# Patient Record
Sex: Male | Born: 1995 | Race: Black or African American | Hispanic: No | Marital: Single | Smoking: Current every day smoker
Health system: Southern US, Community
[De-identification: ages and names within clinical notes are randomized; demographics above are authoritative.]

## PROBLEM LIST (undated history)

## (undated) ENCOUNTER — Emergency Department (HOSPITAL_COMMUNITY): Disposition: A | Discharge status: Home / Self Care | Payer: Medicaid Other

## (undated) DIAGNOSIS — R079 Chest pain, unspecified: Secondary | ICD-10-CM

## (undated) HISTORY — PX: TONSILLECTOMY: SUR1361

## (undated) HISTORY — PX: ADENOIDECTOMY: SUR15

## (undated) HISTORY — DX: Chest pain, unspecified: R07.9

---
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
## 2015-07-26 ENCOUNTER — Emergency Department (HOSPITAL_COMMUNITY)
Admission: EM | Admit: 2015-07-26 | Discharge: 2015-07-26 | Disposition: A | Discharge status: Home / Self Care | Payer: Medicaid Other | Attending: Emergency Medicine | Admitting: Emergency Medicine

## 2015-07-26 ENCOUNTER — Encounter (HOSPITAL_COMMUNITY): Payer: Self-pay

## 2015-07-26 DIAGNOSIS — R21 Rash and other nonspecific skin eruption: Secondary | ICD-10-CM | POA: Insufficient documentation

## 2015-07-26 DIAGNOSIS — F17210 Nicotine dependence, cigarettes, uncomplicated: Secondary | ICD-10-CM | POA: Insufficient documentation

## 2015-07-26 MED ORDER — DIPHENHYDRAMINE HCL 25 MG PO TABS: 25.0000 mg | ORAL_TABLET | Freq: Four times a day (QID) | ORAL | Status: DC | PRN

## 2015-07-26 MED ORDER — PREDNISONE 20 MG PO TABS
ORAL_TABLET | ORAL | Status: DC
Start: 2015-07-26 — End: 2019-07-08

## 2015-07-26 MED ORDER — DIPHENHYDRAMINE HCL 25 MG PO CAPS
25.0000 mg | ORAL_CAPSULE | Freq: Once | ORAL | Status: AC
  Administered 2015-07-26: 25 mg via ORAL
  Filled 2015-07-26: qty 1

## 2015-07-26 MED ORDER — PREDNISONE 20 MG PO TABS
60.0000 mg | ORAL_TABLET | Freq: Once | ORAL | Status: AC
  Administered 2015-07-26: 60 mg via ORAL
  Filled 2015-07-26: qty 3

## 2015-07-26 NOTE — ED Provider Notes (Signed)
CSN: [id number]     Arrival date & time 07/26/15  1430 History  By signing my name below, I, Adrian Martin, attest that this documentation has been prepared under the direction and in the presence of Garlon Hatchet, PA-C. Electronically Signed: Placido Martin, ED Scribe. 07/26/2015. 3:03 PM.   Chief Complaint  Patient presents with  . Rash   The history is provided by the patient. No language interpreter was used.    HPI Comments: Adrian Martin is a 20 y.o. male who presents to the Emergency Department complaining of worsening, mild, diffuse, non-pruritic, rash across his torso, back, extremities and face x 1 week. Pt notes that he recently began using a new soap but denies having taken any new medications. He denies taking any medications for his symptoms. He denies a PMHx of eczema. He has no known allergies. Pt denies fevers or any other associated symptoms at this time.    History reviewed. No pertinent past medical history. Past Surgical History  Procedure Laterality Date  . Tonsillectomy    . Adenoidectomy     Family History  Problem Relation Age of Onset  . Hypertension Mother   . Hypertension Father    Social History  Substance Use Topics  . Smoking status: Current Every Day Smoker    Types: Cigars  . Smokeless tobacco: Never Used  . Alcohol Use: Yes     Comment: occasinally    Review of Systems A complete 10 system review of systems was obtained and all systems are negative except as noted in the HPI and PMH.   Allergies  Review of patient's allergies indicates no known allergies.  Home Medications   Prior to Admission medications   Not on File   BP 102/64 mmHg  Pulse 74  Temp(Src) 98.6 F (37 C) (Oral)  Resp 16  Ht  (1.803 m)  Wt 160 lb (72.576 kg)  BMI 22.33 kg/m2  SpO2 96%   Physical Exam  Constitutional: He is oriented to person, place, and time. He appears well-developed and well-nourished.  HENT:  Head: Normocephalic and atraumatic.  No  oral lesions or swelling noted, airway widely patent  Eyes: EOM are normal.  Neck: Normal range of motion.  Cardiovascular: Normal rate.   Pulmonary/Chest: Effort normal. No respiratory distress.  Abdominal: Soft.  Musculoskeletal: Normal range of motion.  Neurological: He is alert and oriented to person, place, and time.  Skin: Skin is warm and dry. Rash noted. Rash is maculopapular.  Skin of face, arms, and torso appears very dry with tiny maculopapular rash noted; non-pruritic; no oral lesions; no lesions on palms/soles  Psychiatric: He has a normal mood and affect.  Nursing note and vitals reviewed.  ED Course  Procedures  DIAGNOSTIC STUDIES: Oxygen Saturation is 96% on RA, normal by my interpretation.    COORDINATION OF CARE: 2:59 PM Discussed next steps with pt. He verbalized understanding and is agreeable with the plan.   Labs Review Labs Reviewed - No data to display  Imaging Review No results found.   EKG Interpretation None      MDM   Final diagnoses:  Rash   20 year old male here with rash after using new soap. Skin of face, arms, and torso appears very dry with small maculopapular rash. No oral lesions or airway compromise. Patient with likely contact dermatitis. Instructed to avoid offending agent and to use unscented soaps, lotions, and detergents. Will treat with prednisone and benadryl.  No signs of secondary infection. No  lesions on palms/soles.  Follow up with PCP in 2-3 days. Return precautions discussed. Discussed plan with patient, he/she acknowledged understanding and agreed with plan of care.  Return precautions given for new or worsening symptoms.  I personally performed the services described in this documentation, which was scribed in my presence. The recorded information has been reviewed and is accurate.  Garlon HatchetLisa M Sanders, PA-C 07/26/15 1542  Tilden FossaElizabeth Rees, MD 07/27/15 [phone]

## 2015-07-26 NOTE — Discharge Instructions (Signed)
Start prednisone tomorrow, you have already had today's dose.  Continue benadryl every 6-8 hours as needed. Recommend to change back to gentle soap such as ivory, dove, etc. Return to the ED for new or worsening symptoms.

## 2015-07-26 NOTE — ED Notes (Signed)
Patient states that he noticed a rash on his arms a week ago and is now on face and rest of body.

## 2017-01-07 ENCOUNTER — Encounter (HOSPITAL_COMMUNITY): Payer: Self-pay | Admitting: Emergency Medicine

## 2017-01-07 ENCOUNTER — Emergency Department (HOSPITAL_COMMUNITY): Payer: Self-pay

## 2017-01-07 ENCOUNTER — Emergency Department (HOSPITAL_COMMUNITY)
Admission: EM | Admit: 2017-01-07 | Discharge: 2017-01-07 | Disposition: A | Discharge status: Home / Self Care | Payer: Self-pay | Attending: Emergency Medicine | Admitting: Emergency Medicine

## 2017-01-07 DIAGNOSIS — R079 Chest pain, unspecified: Secondary | ICD-10-CM

## 2017-01-07 DIAGNOSIS — R0789 Other chest pain: Secondary | ICD-10-CM | POA: Insufficient documentation

## 2017-01-07 DIAGNOSIS — F17290 Nicotine dependence, other tobacco product, uncomplicated: Secondary | ICD-10-CM | POA: Insufficient documentation

## 2017-01-07 LAB — BASIC METABOLIC PANEL
ANION GAP: 4 — AB (ref 5–15)
BUN: 17 mg/dL (ref 6–20)
CHLORIDE: 107 mmol/L (ref 101–111)
CO2: 28 mmol/L (ref 22–32)
Calcium: 9.3 mg/dL (ref 8.9–10.3)
Creatinine, Ser: 0.93 mg/dL (ref 0.61–1.24)
GFR calc Af Amer: >60 mL/min (ref >60)
GLUCOSE: 77 mg/dL (ref 65–99)
POTASSIUM: 3.9 mmol/L (ref 3.5–5.1)
Sodium: 139 mmol/L (ref 135–145)

## 2017-01-07 LAB — CBC
HEMATOCRIT: 41.9 % (ref 39.0–52.0)
HEMOGLOBIN: 13.9 g/dL (ref 13.0–17.0)
MCH: 29.7 pg (ref 26.0–34.0)
MCHC: 33.2 g/dL (ref 30.0–36.0)
MCV: 89.5 fL (ref 78.0–100.0)
Platelets: 260 10*3/uL (ref 150–400)
RBC: 4.68 MIL/uL (ref 4.22–5.81)
RDW: 12.3 % (ref 11.5–15.5)
WBC: 7.5 10*3/uL (ref 4.0–10.5)

## 2017-01-07 LAB — I-STAT TROPONIN, ED: Troponin i, poc: 0 ng/mL (ref 0.00–0.08)

## 2017-01-07 MED ORDER — IBUPROFEN 600 MG PO TABS: 600.0000 mg | ORAL_TABLET | Freq: Four times a day (QID) | ORAL | 0 refills | Status: DC | PRN

## 2017-01-07 NOTE — Discharge Instructions (Signed)
Please read attached information. If you experience any new or worsening signs or symptoms please return to the emergency room for evaluation. Please follow-up with your primary care provider or specialist as discussed. Please use medication prescribed only as directed and discontinue taking if you have any concerning signs or symptoms.   °

## 2017-01-07 NOTE — ED Provider Notes (Signed)
[facility]-EMERGENCY DEPT Provider Note   CSN: [id number] Arrival date & time: 01/07/17  1308     History   Chief Complaint Chief Complaint  Patient presents with  . Chest Pain    HPI Adrian Martin is a 21 y.o. male.  HPI    21 year old male presents today with complaints of chest pain.  Patient notes that over the last 4 days he has had intermittent upper chest pain.  He notes that this is tight and heavy, alternating between the left and right superior anterior portions of his chest.  He denies any radiation of symptoms.  He denies any associated shortness of breath, diaphoresis, nausea or vomiting.  Patient denies any abdominal pain.  He notes symptoms are not made worse with movements, inspiration.  Patient notes that once he gets up and starts moving rotating his upper extremities and torso symptoms improve.  Patient denies any history of DVT or PE, denies any significant risk factors were concerning signs or symptoms.  Patient denies any history of cardiac dysfunction in him or any of his close family.  Patient reports he smokes black in [place].   History reviewed. No pertinent past medical history.  There are no active problems to display for this patient.   Past Surgical History:  Procedure Laterality Date  . ADENOIDECTOMY    . TONSILLECTOMY       Home Medications    Prior to Admission medications   Medication Sig Start Date End Date Taking? Authorizing Provider  diphenhydrAMINE (BENADRYL) 25 MG tablet Take 1 tablet (25 mg total) by mouth every 6 (six) hours as needed for itching (Rash). 07/26/15   Garlon Hatchet, PA-C  ibuprofen (ADVIL,MOTRIN) 600 MG tablet Take 1 tablet (600 mg total) by mouth every 6 (six) hours as needed. 01/07/17   Chett Taniguchi, Tinnie Gens, PA-C  predniSONE (DELTASONE) 20 MG tablet Take 40 mg by mouth daily for 3 days, then  by mouth daily for 3 days, then  daily for 3 days 07/26/15   Garlon Hatchet, PA-C    Family History Family History  Problem  Relation Age of Onset  . Hypertension Mother   . Hypertension Father     Social History Social History  Substance Use Topics  . Smoking status: Current Every Day Smoker    Types: Cigars  . Smokeless tobacco: Never Used  . Alcohol use Yes     Comment: occasinally     Allergies   Patient has no known allergies.   Review of Systems Review of Systems  All other systems reviewed and are negative.    Physical Exam Updated Vital Signs BP 125/80   Pulse (!) 55   Temp 98.5 F (36.9 C) (Oral)   Resp 16   Ht  (1.803 m)   Wt 74.8 kg (165 lb)   SpO2 100%   BMI 23.01 kg/m   Physical Exam  Constitutional: He is oriented to person, place, and time. He appears well-developed and well-nourished.  HENT:  Head: Normocephalic and atraumatic.  Eyes: Pupils are equal, round, and reactive to light. Conjunctivae are normal. Right eye exhibits no discharge. Left eye exhibits no discharge. No scleral icterus.  Neck: Normal range of motion. No JVD present. No tracheal deviation present.  Cardiovascular: Normal rate, regular rhythm, normal heart sounds and intact distal pulses.  Exam reveals no gallop and no friction rub.   No murmur heard. Pulmonary/Chest: Effort normal and breath sounds normal. No stridor. No respiratory distress. He has no wheezes.  He has no rales. He exhibits no tenderness.  Musculoskeletal: He exhibits no edema.  Neurological: He is alert and oriented to person, place, and time. Coordination normal.  Psychiatric: He has a normal mood and affect. His behavior is normal. Judgment and thought content normal.  Nursing note and vitals reviewed.    ED Treatments / Results  Labs (all labs ordered are listed, but only abnormal results are displayed) Labs Reviewed  BASIC METABOLIC PANEL - Abnormal; Notable for the following:       Result Value   Anion gap 4 (*)    All other components within normal limits  CBC  I-STAT TROPONIN, ED    EKG  EKG  Interpretation None       Radiology Dg Chest 2 View  Result Date: 01/07/2017 CLINICAL DATA:  Chest pain EXAM: CHEST  2 VIEW COMPARISON:  None. FINDINGS: There is a small granuloma in the medial right base. Lungs are otherwise clear. Heart size and pulmonary vascularity are normal. No adenopathy. No pneumothorax. No bone lesions. IMPRESSION: No edema or consolidation.  Small granuloma medial right base. Electronically Signed   By: Bretta Bang III M.D.   On: 01/07/2017 14:35    Procedures Procedures (including critical care time)  Medications Ordered in ED Medications - No data to display   Initial Impression / Assessment and Plan / ED Course  I have reviewed the triage vital signs and the nursing notes.  Pertinent labs & imaging results that were available during my care of the patient were reviewed by me and considered in my medical decision making (see chart for details).      Final Clinical Impressions(s) / ED Diagnoses   Final diagnoses:  Chest pain, unspecified type    21 year old male presents today with chest pain.  He presently has no chest pain, has reassuring workup with negative troponin, reassuring chest x-ray Reassuring EKG. patient with a very low heart score with no signs of ACS, low Wells PERC negative with very low suspicion for PE.  I have low suspicion for any significant intrathoracic abnormality here today.  Patient will be instructed to use anti-inflammatories, follow-up with primary care or the ED with any new or worsening signs or symptoms.  He verbalized understanding and agreement to today's plan had no further questions or concerns.  New Prescriptions Discharge Medication List as of 01/07/2017  5:00 PM    START taking these medications   Details  ibuprofen (ADVIL,MOTRIN) 600 MG tablet Take 1 tablet (600 mg total) by mouth every 6 (six) hours as needed., Starting Tue 01/07/2017, Print         Corissa Oguinn, [place], PA-C 01/07/17 2110    Melene Plan, DO 01/08/17 [phone]

## 2017-01-07 NOTE — ED Triage Notes (Signed)
Pt reports central chest pain, nonradiating for about the last week. Worse with movement. Denies recent cough, fever, URI sx. VSS.

## 2018-06-28 ENCOUNTER — Encounter (HOSPITAL_COMMUNITY): Payer: Self-pay

## 2018-06-28 ENCOUNTER — Emergency Department (HOSPITAL_COMMUNITY)
Admission: EM | Admit: 2018-06-28 | Discharge: 2018-06-28 | Disposition: A | Discharge status: Home / Self Care | Payer: Self-pay | Attending: Emergency Medicine | Admitting: Emergency Medicine

## 2018-06-28 ENCOUNTER — Other Ambulatory Visit: Payer: Self-pay

## 2018-06-28 DIAGNOSIS — F17210 Nicotine dependence, cigarettes, uncomplicated: Secondary | ICD-10-CM | POA: Insufficient documentation

## 2018-06-28 DIAGNOSIS — R1915 Other abnormal bowel sounds: Secondary | ICD-10-CM | POA: Insufficient documentation

## 2018-06-28 DIAGNOSIS — R82998 Other abnormal findings in urine: Secondary | ICD-10-CM | POA: Insufficient documentation

## 2018-06-28 DIAGNOSIS — R198 Other specified symptoms and signs involving the digestive system and abdomen: Secondary | ICD-10-CM

## 2018-06-28 DIAGNOSIS — Z79899 Other long term (current) drug therapy: Secondary | ICD-10-CM | POA: Insufficient documentation

## 2018-06-28 LAB — URINALYSIS, ROUTINE W REFLEX MICROSCOPIC
Bacteria, UA: NONE SEEN
Bilirubin Urine: NEGATIVE
Glucose, UA: NEGATIVE mg/dL
KETONES UR: NEGATIVE mg/dL
LEUKOCYTE UA: NEGATIVE
Nitrite: NEGATIVE
PROTEIN: NEGATIVE mg/dL
Specific Gravity, Urine: 1.03 (ref 1.005–1.030)
pH: 5 (ref 5.0–8.0)

## 2018-06-28 NOTE — Discharge Instructions (Signed)
1.  You should follow-up with a family doctor for recheck.  If you do not have a family doctor, use the referral number in your discharge instructions to find one. 2.  You can use the discharge instructions regarding diarrhea and loose stool.  Try to eat a healthy balanced diet.  Eat meals at regular time intervals. 3.  A gonorrhea chlamydia test have been done.  Results should be available in 1 to 3 days.  If you develop any pain with urination, drainage from the penis or symptoms concerning for sexually transmitted disease, follow-up with the health department.

## 2018-06-28 NOTE — ED Notes (Signed)
Patient verbalizes understanding of discharge instructions. Opportunity for questioning and answers were provided. Armband removed by staff, pt discharged from ED.  

## 2018-06-28 NOTE — ED Provider Notes (Signed)
[facility] EMERGENCY DEPARTMENT Provider Note   CSN: [id number] Arrival date & time: 06/28/18  1456    History   Chief Complaint Chief Complaint  Patient presents with  . Urinary Tract Infection    HPI Adrian Martin is a 23 y.o. male.     HPI Patient reports he is here for urinary symptoms.  He reports that his urine has seemed kind of dark.  He denies any pain or burning with urination.  He denies frequency.  He denies pain in the testicles.  He reports that for a month now he gets some "rumbling" in his lower abdomen.  He denies there is any associated pain.  Reports sporadically he has loose stool.  He denies any fever, nausea, vomiting.  No upper respiratory symptoms.  He is sexually active.  He reports that his girlfriend gets seen periodically for yeast infections and he did not think he could get that but want to make sure that that was not something he could catch. History reviewed. No pertinent past medical history.  There are no active problems to display for this patient.   Past Surgical History:  Procedure Laterality Date  . ADENOIDECTOMY    . TONSILLECTOMY          Home Medications    Prior to Admission medications   Medication Sig Start Date End Date Taking? Authorizing Provider  diphenhydrAMINE (BENADRYL) 25 MG tablet Take 1 tablet (25 mg total) by mouth every 6 (six) hours as needed for itching (Rash). 07/26/15   Garlon Hatchet, PA-C  ibuprofen (ADVIL,MOTRIN) 600 MG tablet Take 1 tablet (600 mg total) by mouth every 6 (six) hours as needed. 01/07/17   Hedges, Tinnie Gens, PA-C  predniSONE (DELTASONE) 20 MG tablet Take 40 mg by mouth daily for 3 days, then 20mg  by mouth daily for 3 days, then 10mg  daily for 3 days 07/26/15   Garlon Hatchet, PA-C    Family History Family History  Problem Relation Age of Onset  . Hypertension Mother   . Hypertension Father     Social History Social History   Tobacco Use  . Smoking status: Current  Every Day Smoker    Types: Cigars  . Smokeless tobacco: Never Used  Substance Use Topics  . Alcohol use: Yes    Comment: occasinally  . Drug use: Yes    Types: Marijuana    Comment: 3-4 times a week     Allergies   Patient has no known allergies.   Review of Systems Review of Systems 10 Systems reviewed and are negative for acute change except as noted in the HPI.  Physical Exam Updated Vital Signs BP 136/79 (BP Location: Right Arm)   Pulse 83   Temp 98.4 F (36.9 C) (Oral)   Resp 16   Ht 5\' 11"  (1.803 m)   Wt 81.6 kg   SpO2 100%   BMI 25.10 kg/m   Physical Exam Constitutional:      Appearance: He is well-developed.  HENT:     Head: Normocephalic and atraumatic.  Eyes:     Extraocular Movements: Extraocular movements intact.     Conjunctiva/sclera: Conjunctivae normal.  Neck:     Musculoskeletal: Neck supple.  Cardiovascular:     Rate and Rhythm: Normal rate and regular rhythm.     Heart sounds: Normal heart sounds.  Pulmonary:     Effort: Pulmonary effort is normal.     Breath sounds: Normal breath sounds.  Abdominal:  General: Bowel sounds are normal. There is no distension.     Palpations: Abdomen is soft.     Tenderness: There is no abdominal tenderness.  Musculoskeletal: Normal range of motion.  Skin:    General: Skin is warm and dry.  Neurological:     Mental Status: He is alert and oriented to person, place, and time.     GCS: GCS eye subscore is 4. GCS verbal subscore is 5. GCS motor subscore is 6.     Coordination: Coordination normal.      ED Treatments / Results  Labs (all labs ordered are listed, but only abnormal results are displayed) Labs Reviewed  URINALYSIS, ROUTINE W REFLEX MICROSCOPIC - Abnormal; Notable for the following components:      Result Value   Hgb urine dipstick SMALL (*)    All other components within normal limits  URINE CULTURE  GC/CHLAMYDIA PROBE AMP ([place]) NOT AT [facility]    EKG None  Radiology No  results found.  Procedures Procedures (including critical care time)  Medications Ordered in ED Medications - No data to display   Initial Impression / Assessment and Plan / ED Course  I have reviewed the triage vital signs and the nursing notes.  Pertinent labs & imaging results that were available during my care of the patient were reviewed by me and considered in my medical decision making (see chart for details).       Patient reports he has been having some dark urine.  He seemed to have concern for possible exposure to a yeast infection vis--vis his girlfriend.  He denies significant concern for an STD.  He denies pain or burning with urination.  He denies testicular pain.  He denies any lesions in the genital area.  I did advise that a GC chlamydia has been tested empirically and patient is to follow-up on those results.  At this time, based on symptoms I do not believe he needs empiric treatment.  Patient also reports that he gets a lot of rumbling in his abdomen.  He does not have associated pain.  Reports intermittent loose stool.  Have made some dietary recommendations.  At this time no fever, no pain, no vomiting.  Patient stable for dietary management and outpatient follow-up.   Final Clinical Impressions(s) / ED Diagnoses   Final diagnoses:  Dark urine  [place]    ED Discharge Orders    None       Arby Barrette, MD 06/28/18 1659

## 2018-06-28 NOTE — ED Triage Notes (Signed)
Pt presents with c/o dark and foul smelling urine. Pt states his significant other has had recurrent yeast infections, states she told him "you probably have a yeast infection too because we keep having sex and I have one".

## 2018-06-28 NOTE — ED Notes (Signed)
Pt aware of need for urine sample, states unable to void at this time

## 2018-06-29 LAB — URINE CULTURE: Culture: NO GROWTH

## 2018-06-29 LAB — GC/CHLAMYDIA PROBE AMP (~~LOC~~) NOT AT ARMC
Chlamydia: NEGATIVE
NEISSERIA GONORRHEA: NEGATIVE

## 2019-03-19 IMAGING — CR DG CHEST 2V
2 series · 2 of 2 positions shown · non-contrast
Comparison: None.

CLINICAL DATA: Chest pain

EXAM:
CHEST  2 VIEW

[chest pa]
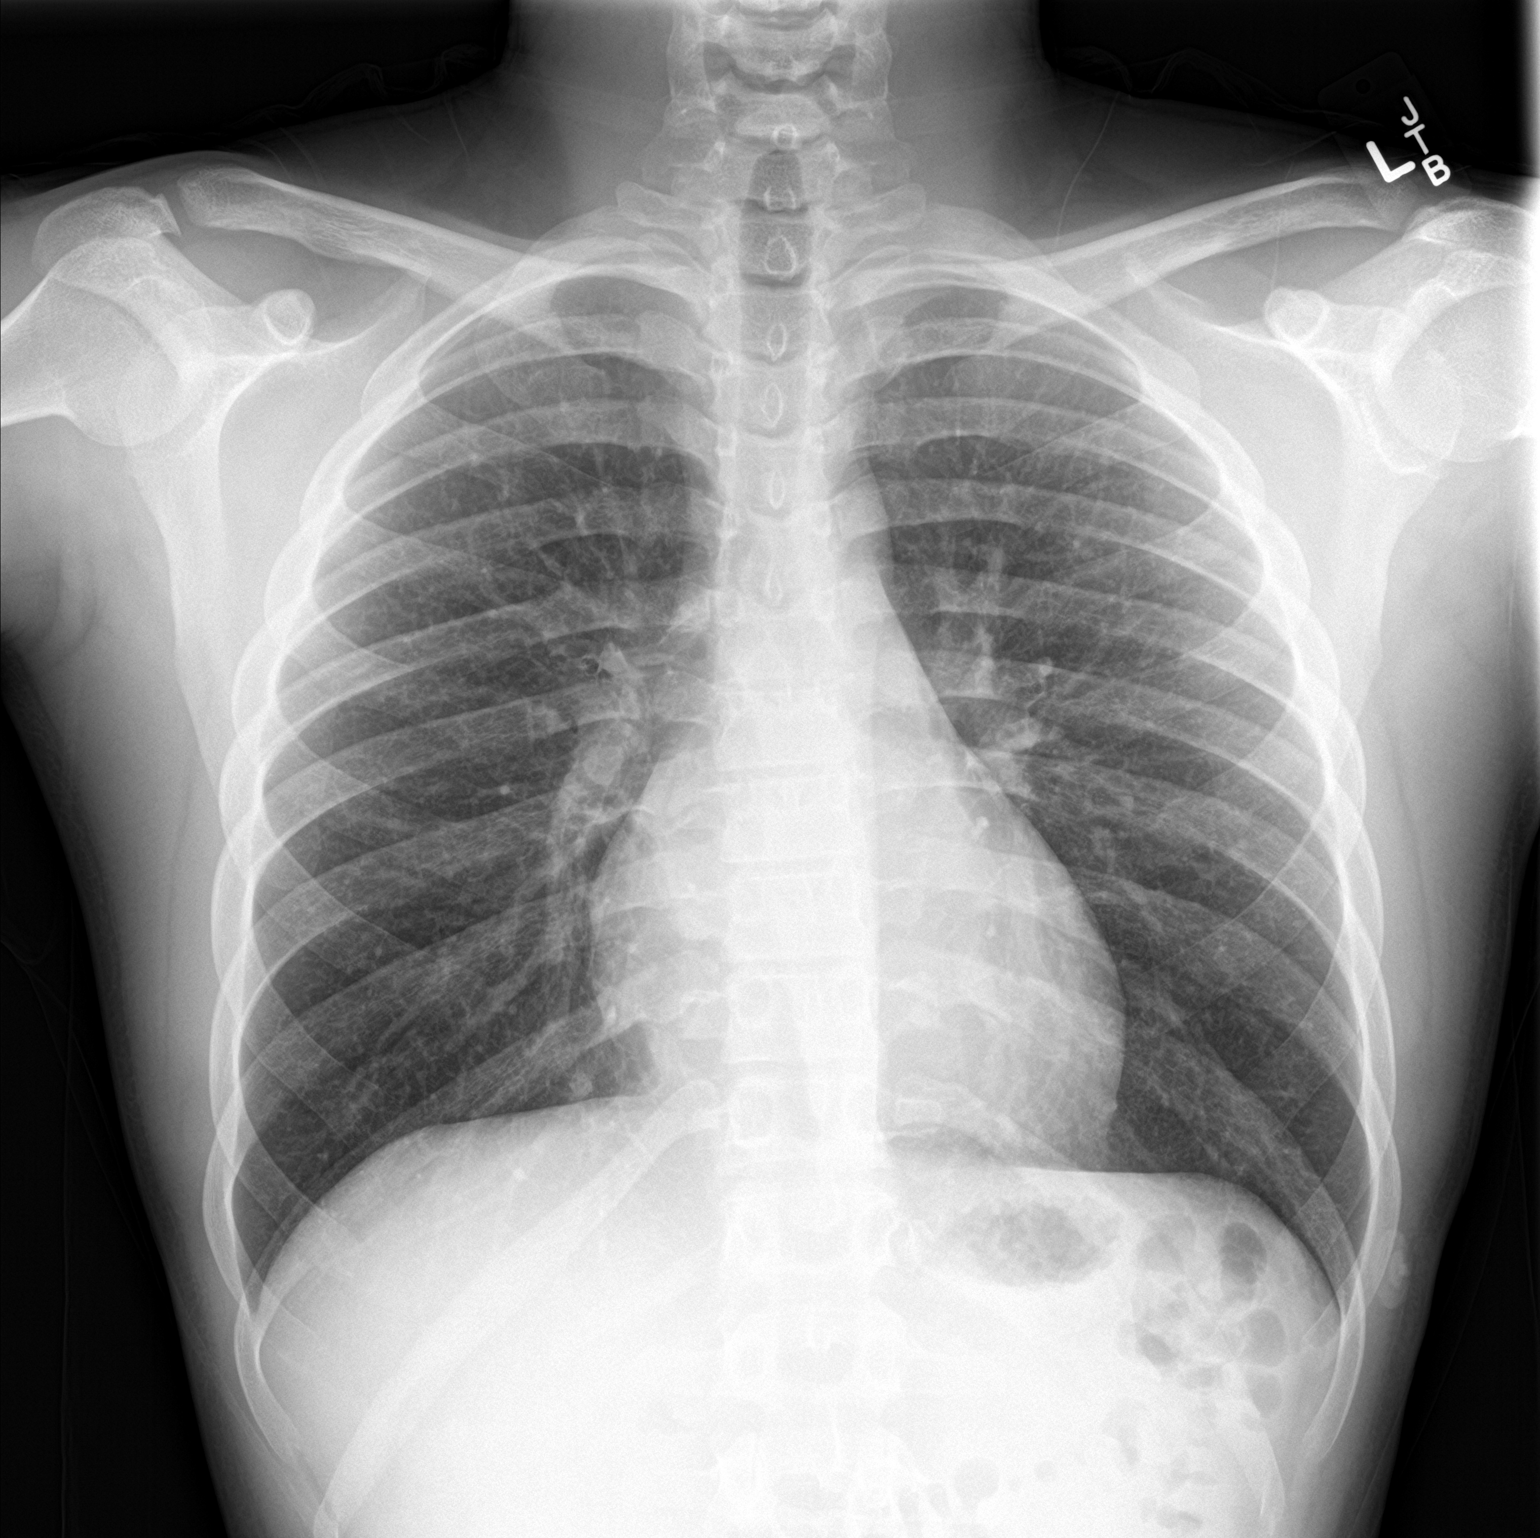

[chest lat]
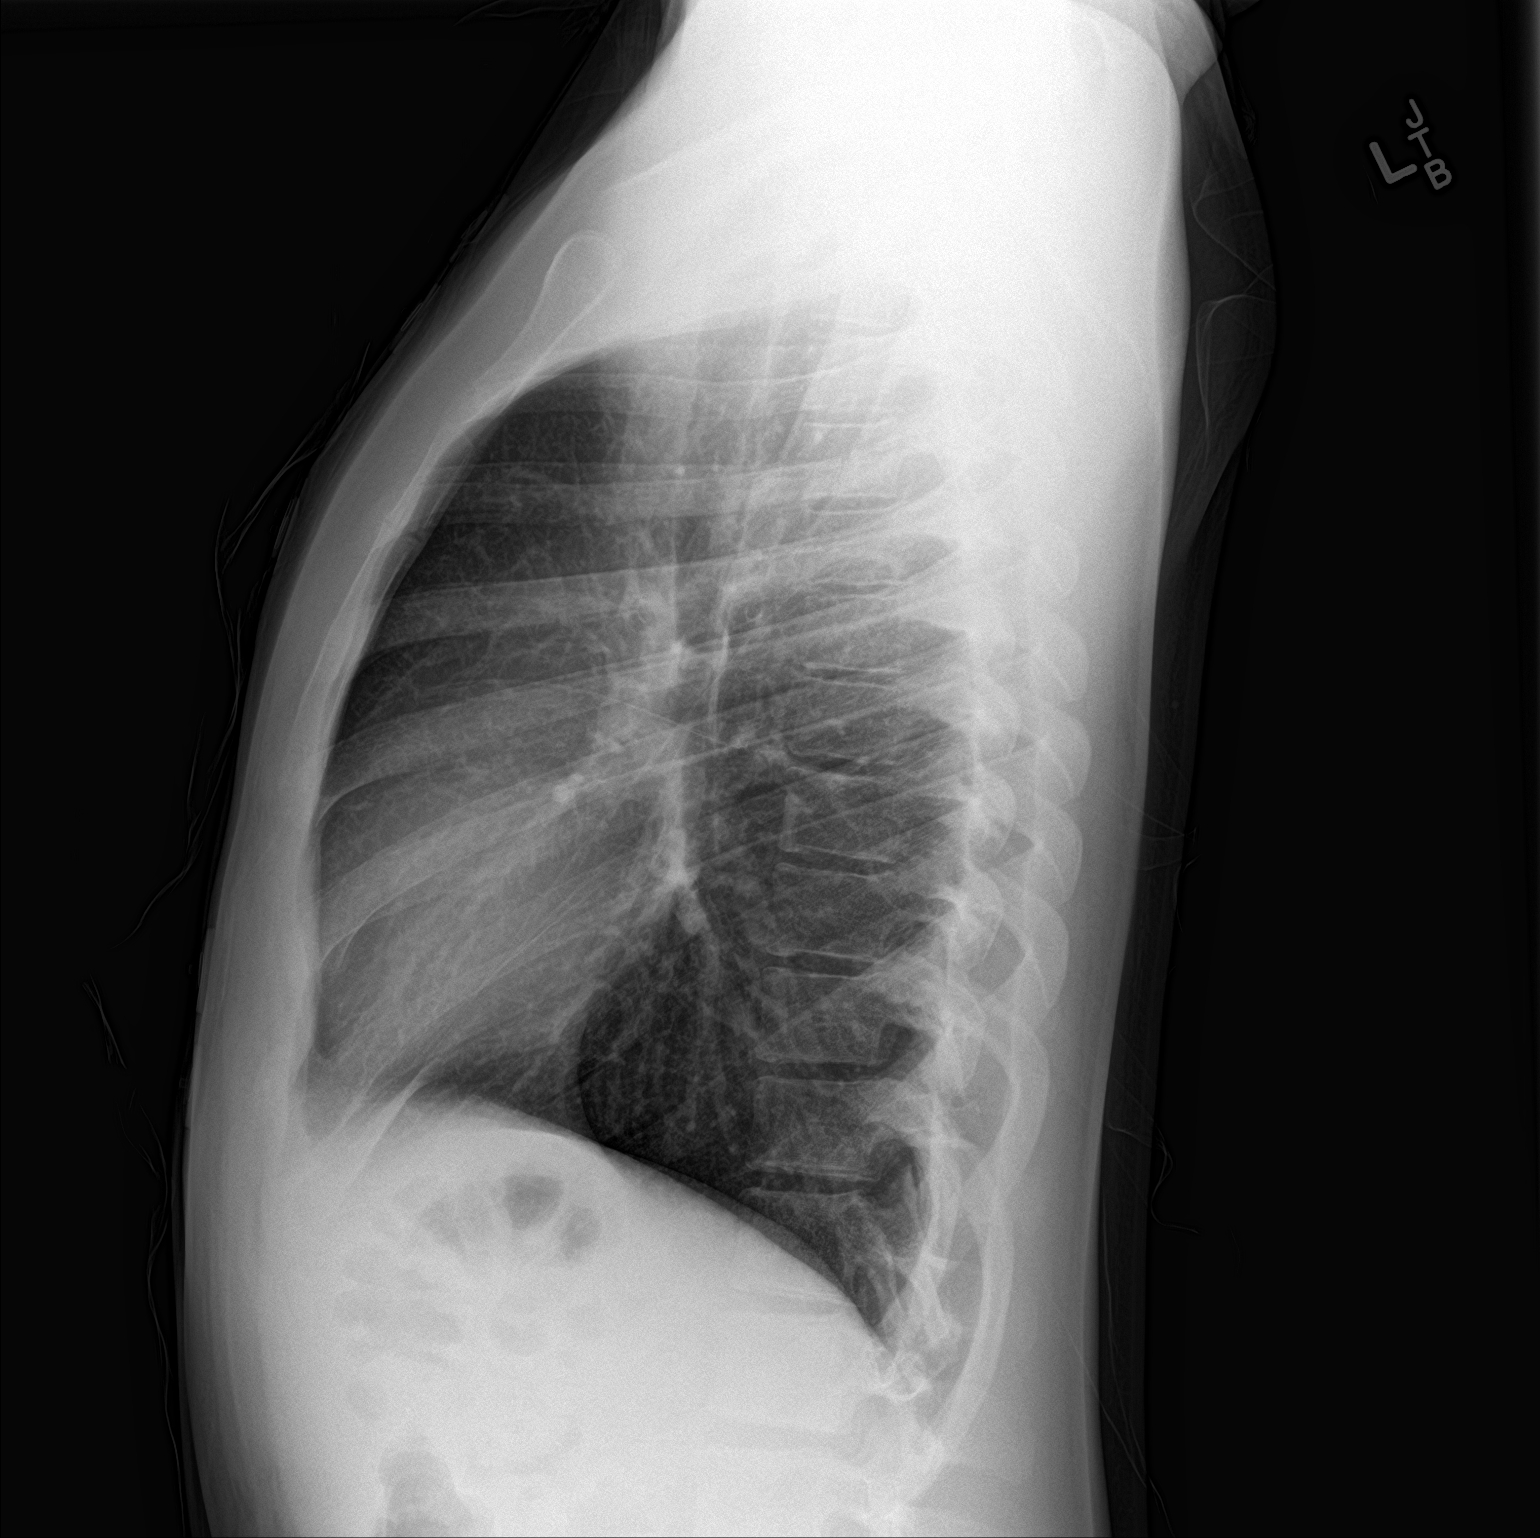

[2 of 2 positions shown; findings below may reference images not displayed]

FINDINGS: There is a small granuloma in the medial right base. Lungs are
otherwise clear. Heart size and pulmonary vascularity are normal. No
adenopathy. No pneumothorax. No bone lesions.
IMPRESSION: No edema or consolidation.  Small granuloma medial right base.

## 2019-07-08 ENCOUNTER — Ambulatory Visit (HOSPITAL_COMMUNITY)
Admission: EM | Admit: 2019-07-08 | Discharge: 2019-07-08 | Disposition: A | Discharge status: Home / Self Care | Payer: Medicaid Other | Attending: Family Medicine | Admitting: Family Medicine

## 2019-07-08 ENCOUNTER — Other Ambulatory Visit: Payer: Self-pay

## 2019-07-08 ENCOUNTER — Encounter (HOSPITAL_COMMUNITY): Payer: Self-pay

## 2019-07-08 DIAGNOSIS — Z20822 Contact with and (suspected) exposure to covid-19: Secondary | ICD-10-CM | POA: Diagnosis present

## 2019-07-08 NOTE — Discharge Instructions (Signed)
  You must quarantine at home until your test result is available You can check for your test result in MyChart  

## 2019-07-08 NOTE — ED Triage Notes (Signed)
Pt states he needs a COVID test for employer. Pt denies symptoms.

## 2019-07-08 NOTE — ED Provider Notes (Signed)
[facility]    CSN: [id number] Arrival date & time: 07/08/19  1710      History   Chief Complaint Chief Complaint  Patient presents with  . COVID test for employer    HPI Adrian Martin is a 24 y.o. male.   HPI  Patient is required to have a Covid test for his work He is asymptomatic No known exposure  History reviewed. No pertinent past medical history.  There are no problems to display for this patient.   Past Surgical History:  Procedure Laterality Date  . ADENOIDECTOMY    . TONSILLECTOMY         Home Medications    Prior to Admission medications   Medication Sig Start Date End Date Taking? Authorizing Provider  diphenhydrAMINE (BENADRYL) 25 MG tablet Take 1 tablet (25 mg total) by mouth every 6 (six) hours as needed for itching (Rash). 07/26/15 07/08/19  Larene Pickett, PA-C    Family History Family History  Problem Relation Age of Onset  . Hypertension Mother   . Hypertension Father     Social History Social History   Tobacco Use  . Smoking status: Current Every Day Smoker    Types: Cigars  . Smokeless tobacco: Never Used  . Tobacco comment: black and milds 2-3   Substance Use Topics  . Alcohol use: Yes    Comment: occasinally  . Drug use: Not Currently    Types: Marijuana    Comment: 3-4 times a week     Allergies   Patient has no known allergies.   Review of Systems Review of Systems  Constitutional:       Denies symptoms     Physical Exam Triage Vital Signs ED Triage Vitals [07/08/19 1814]  Enc Vitals Group     BP 136/81     Pulse Rate 82     Resp 16     Temp 98.6 F (37 C)     Temp Source Oral     SpO2 100 %     Weight 200 lb (90.7 kg)     Height 5\' 11"  (1.803 m)     Head Circumference      Peak Flow      Pain Score 0     Pain Loc      Pain Edu?      Excl. in [place]?    No data found.  Updated Vital Signs BP 136/81   Pulse 82   Temp 98.6 F (37 C) (Oral)   Resp 16   Ht 5\' 11"  (1.803 m)   Wt 90.7  kg   SpO2 100%   BMI 27.89 kg/m     Physical Exam Constitutional:      General: He is not in acute distress.    Appearance: He is well-developed.     Comments: Exam by observation.  Mask in place.  Pleasant and cooperative  HENT:     Head: Normocephalic and atraumatic.  Eyes:     Conjunctiva/sclera: Conjunctivae normal.     Pupils: Pupils are equal, round, and reactive to light.  Cardiovascular:     Rate and Rhythm: Normal rate.  Pulmonary:     Effort: Pulmonary effort is normal. No respiratory distress.  Musculoskeletal:        General: Normal range of motion.     Cervical back: Normal range of motion.  Skin:    General: Skin is warm and dry.  Neurological:     Mental Status: He  is alert.  Psychiatric:        Mood and Affect: Mood normal.      UC Treatments / Results  Labs (all labs ordered are listed, but only abnormal results are displayed) Labs Reviewed  SARS CORONAVIRUS 2 (TAT 6-24 HRS)    EKG   Radiology No results found.  Procedures Procedures (including critical care time)  Medications Ordered in UC Medications - No data to display  Initial Impression / Assessment and Plan / UC Course  I have reviewed the triage vital signs and the nursing notes.  Pertinent labs & imaging results that were available during my care of the patient were reviewed by me and considered in my medical decision making (see chart for details).      Final Clinical Impressions(s) / UC Diagnoses   Final diagnoses:  Encounter for screening laboratory testing for COVID-19 virus in asymptomatic patient     Discharge Instructions      You must quarantine at home until your test result is available You can check for your test result in MyChart    ED Prescriptions    None     PDMP not reviewed this encounter.   Eustace Moore, MD 07/08/19 2056

## 2019-07-09 LAB — SARS CORONAVIRUS 2 (TAT 6-24 HRS): SARS Coronavirus 2: NEGATIVE

## 2020-01-26 ENCOUNTER — Emergency Department (HOSPITAL_COMMUNITY)
Admission: EM | Admit: 2020-01-26 | Discharge: 2020-01-26 | Disposition: A | Discharge status: Home / Self Care | Payer: Medicaid Other | Attending: Emergency Medicine | Admitting: Emergency Medicine

## 2020-01-26 ENCOUNTER — Encounter (HOSPITAL_COMMUNITY): Payer: Self-pay | Admitting: Emergency Medicine

## 2020-01-26 ENCOUNTER — Emergency Department (HOSPITAL_COMMUNITY): Payer: Medicaid Other

## 2020-01-26 ENCOUNTER — Other Ambulatory Visit: Payer: Self-pay

## 2020-01-26 DIAGNOSIS — R0789 Other chest pain: Secondary | ICD-10-CM

## 2020-01-26 DIAGNOSIS — R079 Chest pain, unspecified: Secondary | ICD-10-CM | POA: Insufficient documentation

## 2020-01-26 DIAGNOSIS — F17290 Nicotine dependence, other tobacco product, uncomplicated: Secondary | ICD-10-CM | POA: Insufficient documentation

## 2020-01-26 LAB — CBC WITH DIFFERENTIAL/PLATELET
Abs Immature Granulocytes: 0.02 10*3/uL (ref 0.00–0.07)
Basophils Absolute: 0 10*3/uL (ref 0.0–0.1)
Basophils Relative: 1 %
Eosinophils Absolute: 0 10*3/uL (ref 0.0–0.5)
Eosinophils Relative: 1 %
HCT: 42.3 % (ref 39.0–52.0)
Hemoglobin: 14 g/dL (ref 13.0–17.0)
Immature Granulocytes: 0 %
Lymphocytes Relative: 18 %
Lymphs Abs: 1.4 10*3/uL (ref 0.7–4.0)
MCH: 31.2 pg (ref 26.0–34.0)
MCHC: 33.1 g/dL (ref 30.0–36.0)
MCV: 94.2 fL (ref 80.0–100.0)
Monocytes Absolute: 0.5 10*3/uL (ref 0.1–1.0)
Monocytes Relative: 7 %
Neutro Abs: 5.4 10*3/uL (ref 1.7–7.7)
Neutrophils Relative %: 73 %
Platelets: 377 10*3/uL (ref 150–400)
RBC: 4.49 MIL/uL (ref 4.22–5.81)
RDW: 12.2 % (ref 11.5–15.5)
WBC: 7.4 10*3/uL (ref 4.0–10.5)
nRBC: 0 % (ref 0.0–0.2)

## 2020-01-26 LAB — COMPREHENSIVE METABOLIC PANEL
ALT: 35 U/L (ref 0–44)
AST: 31 U/L (ref 15–41)
Albumin: 4.5 g/dL (ref 3.5–5.0)
Alkaline Phosphatase: 68 U/L (ref 38–126)
Anion gap: 11 (ref 5–15)
BUN: 9 mg/dL (ref 6–20)
CO2: 23 mmol/L (ref 22–32)
Calcium: 9.7 mg/dL (ref 8.9–10.3)
Chloride: 103 mmol/L (ref 98–111)
Creatinine, Ser: 0.97 mg/dL (ref 0.61–1.24)
GFR, Estimated: >60 mL/min (ref >60)
Glucose, Bld: 109 mg/dL — ABNORMAL HIGH (ref 70–99)
Potassium: 4.2 mmol/L (ref 3.5–5.1)
Sodium: 137 mmol/L (ref 135–145)
Total Bilirubin: 1.2 mg/dL (ref 0.3–1.2)
Total Protein: 7.6 g/dL (ref 6.5–8.1)

## 2020-01-26 NOTE — ED Triage Notes (Signed)
Pt c/o L sided chest pain that started this morning, reports ems came out and did a 12 lead that they reported looked okay, pt states pain has not got any better so he would like to get checked out. Denies worsening of pain with movement, no recent trauma or known injury, no cough or shortness of breath.

## 2020-01-26 NOTE — Discharge Instructions (Signed)
Your pain is likely from stress or muscle strain   Take tylenol, motrin for pain   See your doctor   Rest tomorrow   Return to ER if you have worse chest pain, shortness of breath

## 2020-01-26 NOTE — ED Provider Notes (Signed)
[facility] EMERGENCY DEPARTMENT Provider Note   CSN: [id number] Arrival date & time: 01/26/20  1251     History Chief Complaint  Patient presents with  . Chest Pain    Adrian Martin is a 24 y.o. male you presenting with chest pain.  Patient has left-sided chest pain for the last several days.  Patient states that he has a newborn who is still in the hospital.  He states that the newborn has a lot of problems so he is under a lot of stress.  He states that the pain is worse with movement and also at rest.  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  Denies any shortness of breath or fevers or cough.  Denies any heart problems in the past.  The history is provided by the patient.       History reviewed. No pertinent past medical history.  There are no problems to display for this patient.   Past Surgical History:  Procedure Laterality Date  . ADENOIDECTOMY    . TONSILLECTOMY         Family History  Problem Relation Age of Onset  . Hypertension Mother   . Hypertension Father     Social History   Tobacco Use  . Smoking status: Current Every Day Smoker    Types: Cigars  . Smokeless tobacco: Never Used  . Tobacco comment: black and milds 2-3   Substance Use Topics  . Alcohol use: Yes    Comment: occasinally  . Drug use: Not Currently    Types: Marijuana    Comment: 3-4 times a week    Home Medications Prior to Admission medications   Medication Sig Start Date End Date Taking? Authorizing Provider  diphenhydrAMINE (BENADRYL) 25 MG tablet Take 1 tablet (25 mg total) by mouth every 6 (six) hours as needed for itching (Rash). 07/26/15 07/08/19  Garlon Hatchet, PA-C    Allergies    Patient has no known allergies.  Review of Systems   Review of Systems  Cardiovascular: Positive for chest pain.  All other systems reviewed and are negative.   Physical Exam Updated Vital Signs BP 128/84 (BP Location: Left Arm)   Pulse 63    Temp 98.4 F (36.9 C)   Resp 16   SpO2 100%   Physical Exam Vitals and nursing note reviewed.  HENT:     Head: Normocephalic.  Eyes:     Pupils: Pupils are equal, round, and reactive to light.  Cardiovascular:     Rate and Rhythm: Normal rate and regular rhythm.     Heart sounds: Normal heart sounds.  Pulmonary:     Effort: Pulmonary effort is normal.     Breath sounds: Normal breath sounds.  Chest:     Comments: No reproducible tenderness  Abdominal:     General: Bowel sounds are normal.     Palpations: Abdomen is soft.  Musculoskeletal:        General: Normal range of motion.     Cervical back: Normal range of motion and neck supple.  Skin:    General: Skin is warm.     Capillary Refill: Capillary refill takes less than 2 seconds.  Neurological:     General: No focal deficit present.     Mental Status: He is alert and oriented to person, place, and time.  Psychiatric:        Mood and Affect: Mood normal.  Behavior: Behavior normal.     ED Results / Procedures / Treatments   Labs (all labs ordered are listed, but only abnormal results are displayed) Labs Reviewed  COMPREHENSIVE METABOLIC PANEL - Abnormal; Notable for the following components:      Result Value   Glucose, Bld 109 (*)    All other components within normal limits  CBC WITH DIFFERENTIAL/PLATELET    EKG EKG Interpretation  Date/Time:  Wednesday January 26 2020 13:09:46 EDT Ventricular Rate:  75 PR Interval:  136 QRS Duration: 80 QT Interval:  352 QTC Calculation: 393 R Axis:   44 Text Interpretation: Normal sinus rhythm ST elevation, consider early repolarization, pericarditis, or injury Abnormal ECG No significant change since last tracing Confirmed by Richardean Canal [phone]) on 01/26/2020 6:44:10 PM   Radiology DG Chest 2 View  Result Date: 01/26/2020 CLINICAL DATA:  Chest pain. EXAM: CHEST - 2 VIEW COMPARISON:  01/07/2017 FINDINGS: The heart size and mediastinal contours are within  normal limits. No consolidation. Small granuloma at the right lung base better seen on prior. No pleural effusions or pneumothorax. The visualized skeletal structures are unremarkable. IMPRESSION: No acute cardiopulmonary disease. Electronically Signed   By: Feliberto Harts MD   On: 01/26/2020 13:46    Procedures Procedures (including critical care time)  Medications Ordered in ED Medications - No data to display  ED Course  I have reviewed the triage vital signs and the nursing notes.  Pertinent labs & imaging results that were available during my care of the patient were reviewed by me and considered in my medical decision making (see chart for details).    MDM Rules/Calculators/A&P                         Adrian Martin is a 24 y.o. male who presented with chest pain.  EKG showed J-point elevation.  Patient's pain has been going on for several days.  Is worse with movement I think costochondritis versus stress.  Labs and chest x-ray are not remarkable.  I doubt ACS or PE.  Recommend Motrin as needed and outpatient follow-up.   Final Clinical Impression(s) / ED Diagnoses Final diagnoses:  None    Rx / DC Orders ED Discharge Orders    None       Charlynne Pander, MD 01/26/20 [phone]

## 2020-04-13 ENCOUNTER — Encounter (HOSPITAL_COMMUNITY): Payer: Self-pay | Admitting: Emergency Medicine

## 2020-04-13 ENCOUNTER — Other Ambulatory Visit: Payer: Self-pay

## 2020-04-13 ENCOUNTER — Ambulatory Visit (HOSPITAL_COMMUNITY)
Admission: EM | Admit: 2020-04-13 | Discharge: 2020-04-13 | Disposition: A | Discharge status: Home / Self Care | Payer: Medicaid Other | Attending: Family Medicine | Admitting: Family Medicine

## 2020-04-13 DIAGNOSIS — R0789 Other chest pain: Secondary | ICD-10-CM

## 2020-04-13 MED ORDER — MELOXICAM 15 MG PO TABS: 15.0000 mg | ORAL_TABLET | Freq: Every day | ORAL | 0 refills | Status: DC

## 2020-04-13 NOTE — Discharge Instructions (Signed)
Take meloxicam one time a day with food See cardiology if pain worsens

## 2020-04-13 NOTE — ED Triage Notes (Addendum)
Pt presents with chest pain. States was seen in Oct for same reason but was told possible muscle related. States pain comes and goes and feels like tightness or pulling sensation.   Selena Batten, RN notified of Pt's chest pain.

## 2020-04-13 NOTE — ED Provider Notes (Signed)
[facility]    CSN: [id number] Arrival date & time: 04/13/20  1542      History   Chief Complaint Chief Complaint  Patient presents with  . Chest Pain    HPI Adrian Martin is a 24 y.o. male.   HPI   Patient was seen in emergency room for chest pain a couple of months back.  It was 3 months ago, when his daughter was born.  She is now fine but the time she was in the ICU.  It was postulated he might be having chest pain because of stress.  He denies that he is having any stress right now but he still has chest pain.  It is every few days.  It is fleeting.  It is treated with Tylenol.  It is not exertional.  It is not worse with a deep breath.  He has no shortness of breath.  He has no underlying lung disease.  He has stopped smoking.  He has cut back on his alcohol.  He works doing a lot of heavy lifting.  He cannot reproduce it with arm movement, deep breath, or chest palpation.  He denies any GI distress.  Denies epigastric pain, heartburn, belching. He has been looking on the Internet.  He is still worried that he might have something wrong with his heart He has no family history of heart disease.  No hypertension, hyperlipidemia, diabetes, cigarette smoking (Black and mild only)  History reviewed. No pertinent past medical history.  There are no problems to display for this patient.   Past Surgical History:  Procedure Laterality Date  . ADENOIDECTOMY    . TONSILLECTOMY         Home Medications    Prior to Admission medications   Medication Sig Start Date End Date Taking? Authorizing Provider  meloxicam (MOBIC) 15 MG tablet Take 1 tablet (15 mg total) by mouth daily. 04/13/20  Yes Eustace Moore, MD  diphenhydrAMINE (BENADRYL) 25 MG tablet Take 1 tablet (25 mg total) by mouth every 6 (six) hours as needed for itching (Rash). 07/26/15 07/08/19  Garlon Hatchet, PA-C    Family History Family History  Problem Relation Age of Onset  . Hypertension Mother    . Hypertension Father     Social History Social History   Tobacco Use  . Smoking status: Current Every Day Smoker    Types: Cigars  . Smokeless tobacco: Never Used  . Tobacco comment: black and milds 2-3   Substance Use Topics  . Alcohol use: Yes    Comment: occasinally  . Drug use: Not Currently    Types: Marijuana    Comment: 3-4 times a week     Allergies   Patient has no known allergies.   Review of Systems Review of Systems See HPI  Physical Exam Triage Vital Signs ED Triage Vitals  Enc Vitals Group     BP 04/13/20 1829 124/70     Pulse Rate 04/13/20 1829 62     Resp 04/13/20 1829 16     Temp 04/13/20 1829 98.3 F (36.8 C)     Temp Source 04/13/20 1829 Oral     SpO2 04/13/20 1829 100 %     Weight --      Height --      Head Circumference --      Peak Flow --      Pain Score 04/13/20 1827 1     Pain Loc --  Pain Edu? --      Excl. in GC? --    No data found.  Updated Vital Signs BP 124/70 (BP Location: Right Arm)   Pulse 62   Temp 98.3 F (36.8 C) (Oral)   Resp 16   SpO2 100%      Physical Exam Vitals reviewed.  Constitutional:      General: He is not in acute distress.    Appearance: He is well-developed and well-nourished.  HENT:     Head: Normocephalic and atraumatic.     Mouth/Throat:     Mouth: Oropharynx is clear and moist.  Eyes:     Conjunctiva/sclera: Conjunctivae normal.     Pupils: Pupils are equal, round, and reactive to light.  Cardiovascular:     Rate and Rhythm: Normal rate and regular rhythm.     Heart sounds: Normal heart sounds.  Pulmonary:     Effort: Pulmonary effort is normal. No respiratory distress.     Breath sounds: Normal breath sounds.  Chest:     Chest wall: No tenderness.  Abdominal:     General: Abdomen is flat. There is no distension.     Palpations: Abdomen is soft.     Tenderness: There is no abdominal tenderness.  Musculoskeletal:        General: No edema. Normal range of motion.      Cervical back: Normal range of motion.  Skin:    General: Skin is warm and dry.  Neurological:     Mental Status: He is alert.  Psychiatric:        Behavior: Behavior normal.    Normal physical examination  UC Treatments / Results  Labs (all labs ordered are listed, but only abnormal results are displayed) Labs Reviewed - No data to display  EKG   Radiology No results found.  Procedures Procedures (including critical care time)  Medications Ordered in UC Medications - No data to display  Initial Impression / Assessment and Plan / UC Course  I have reviewed the triage vital signs and the nursing notes.  Pertinent labs & imaging results that were available during my care of the patient were reviewed by me and considered in my medical decision making (see chart for details).     EKG, chest x-ray, labs, and note from last visit are reviewed.  Patient does not have chest pain consistent with a cardiac etiology by history.  No cardiac risk factors.  In spite of this he has concern after recurring chest pain so will refer to cardiology. Final Clinical Impressions(s) / UC Diagnoses   Final diagnoses:  Chest wall pain  Atypical chest pain     Discharge Instructions     Take meloxicam one time a day with food See cardiology if pain worsens    ED Prescriptions    Medication Sig Dispense Auth. Provider   meloxicam (MOBIC) 15 MG tablet Take 1 tablet (15 mg total) by mouth daily. 30 tablet Eustace Moore, MD     PDMP not reviewed this encounter.   Eustace Moore, MD 04/13/20 2014

## 2020-05-10 ENCOUNTER — Other Ambulatory Visit: Payer: Self-pay

## 2020-05-10 ENCOUNTER — Encounter: Payer: Self-pay | Admitting: Internal Medicine

## 2020-05-10 ENCOUNTER — Other Ambulatory Visit: Payer: Self-pay | Admitting: *Deleted

## 2020-05-10 ENCOUNTER — Ambulatory Visit (INDEPENDENT_AMBULATORY_CARE_PROVIDER_SITE_OTHER): Payer: Self-pay | Admitting: Internal Medicine

## 2020-05-10 VITALS — BP 132/78 | HR 77 | Ht 70.0 in | Wt 196.0 lb

## 2020-05-10 DIAGNOSIS — R079 Chest pain, unspecified: Secondary | ICD-10-CM

## 2020-05-10 LAB — SEDIMENTATION RATE: Sed Rate: 4 mm/hr (ref 0–15)

## 2020-05-10 NOTE — Progress Notes (Signed)
Cardiology Office Note:    Date:  05/10/2020   ID:  Adrian Martin, DOB 1995-11-15, MRN [id number]  PCP:  Patient, No Pcp Per  [facility] Cardiologist:  Werner Lean, MD  [place] Electrophysiologist:  None   CC: Chest problems Consulted for the evaluation of chest pain at the behest of Dr. Lysle Morales   History of Present Illness:    Adrian Martin is a 25 y.o. male with a hx of chest wall pain former smoker with multiple ED visits who presents for evaluation 05/10/20.  Patient notes that he is feeling OK now.   Patient has had two episodes of chest pain that comes and goes.  All of this started in October.  Had a daughter around this time.  Had ED visit for chest pain with unremarkable findings and was told it was stress (woke with chest with chest tightness).  At that time improved with Tylenol and resolved spontaneously.  Improved  But then returned in late November and Early December.  No new stressors.  No change in activity.  Went to urgent care.  Last had chest pain yesterday mid day. Works at [employer], and loads heavy things.  We was off work at this point but still have chest tightness is sometimes on the right side and sometimes on the left side.    Discomfort occurs with laying down, worsens with no stressors, and improves spontaneously.  Patient exertion notable for his job at [employer] with and feels no symptoms.  No shortness of breath.  Notes that he thinks he has COVID in late August (anosmia, SOB, fevers and chills).  No PND or orthopnea.  No bendopnea, weight gain, leg swelling , or abdominal swelling.  No syncope or near syncope.  Never received COVID-19 Vaccine.   Notes no palpitations or funny heart beats.     No history prematurity.    Past Medical History:  Diagnosis Date  . Chest pain     Past Surgical History:  Procedure Laterality Date  . ADENOIDECTOMY    . TONSILLECTOMY      Current Medications: Current Meds  Medication Sig  . acetaminophen  (TYLENOL) 500 MG tablet Take 1,000 mg by mouth as needed for moderate pain or mild pain.  . meloxicam (MOBIC) 15 MG tablet Take 1 tablet (15 mg total) by mouth daily. (Patient taking differently: Take 15 mg by mouth as needed.)     Allergies:   Patient has no known allergies.   Social History   Socioeconomic History  . Marital status: Single    Spouse name: Not on file  . Number of children: Not on file  . Years of education: Not on file  . Highest education level: Not on file  Occupational History  . Not on file  Tobacco Use  . Smoking status: Current Every Day Smoker    Types: Cigars  . Smokeless tobacco: Never Used  . Tobacco comment: black and milds 2-3   Substance and Sexual Activity  . Alcohol use: Yes    Comment: occasinally  . Drug use: Not Currently    Types: Marijuana    Comment: 3-4 times a week  . Sexual activity: Not on file  Other Topics Concern  . Not on file  Social History Narrative  . Not on file   Social Determinants of Health   Financial Resource Strain: Not on file  Food Insecurity: Not on file  Transportation Needs: Not on file  Physical Activity: Not on file  Stress: Not on file  Social Connections: Not on file     Family History: The patient's family history includes Hypertension in his father and mother. History of coronary artery disease notable for no members. History of heart failure notable for no members.  History of arrhythmia notable for no members.  ROS:   Please see the history of present illness.     All other systems reviewed and are negative.  EKGs/Labs/Other Studies Reviewed:    The following studies were reviewed today:  EKG:   05/10/20:  SR rate 77 no ST elevations  01/27/20:  SR rate 75 septal ST elevations without reciprocal changes or PR depression; consider normal variant  Recent Labs: 01/26/2020: ALT 35; BUN 9; Creatinine, Ser 0.97; Hemoglobin 14.0; Platelets 377; Potassium 4.2; Sodium 137  Recent Lipid  Panel No results found for: CHOL, TRIG, HDL, CHOLHDL, VLDL, LDLCALC, LDLDIRECT   Risk Assessment/Calculations:     N/A  Physical Exam:    VS:  BP 132/78   Pulse 77   Ht $R'5\' 10"'ES$  (1.778 m)   Wt 196 lb (88.9 kg)   SpO2 98%   BMI 28.12 kg/m     Wt Readings from Last 3 Encounters:  05/10/20 196 lb (88.9 kg)  07/08/19 200 lb (90.7 kg)  06/28/18 180 lb (81.6 kg)     GEN:  Well nourished, well developed in no acute distress HEENT: Normal NECK: No JVD; No carotid bruits LYMPHATICS: No lymphadenopathy CARDIAC: RRR, no murmurs, rubs, gallops RESPIRATORY:  Clear to auscultation without rales, wheezing or rhonchi  ABDOMEN: Soft, non-tender, non-distended MUSCULOSKELETAL:  No edema; No deformity  SKIN: Warm and dry NEUROLOGIC:  Alert and oriented x 3 PSYCHIATRIC:  Normal affect   ASSESSMENT:    1. Chest pain of uncertain etiology    PLAN:    Chest Pain Syndrome - The patient presents with atypical symptoms - all started after viral prodrome - will get ESR, CRP and if negative, would recommend exerciseECG stress test (NPO at midnight); discussed risks, benefits, and alternatives of the diagnostic procedure including chest pain, arrhythmia, and death.  Patient amenable for testing.  Three months follow up unless new symptoms or abnormal test results warranting change in plan  Would be reasonable for Virtual Follow up  Would be reasonable for  APP Follow up  Shared Decision Making/Informed Consent The risks [chest pain, shortness of breath, cardiac arrhythmias, dizziness, blood pressure fluctuations, myocardial infarction, stroke/transient ischemic attack, and life-threatening complications (estimated to be 1 in 10,000)], benefits (risk stratification, diagnosing coronary artery disease, treatment guidance) and alternatives of an exercise tolerance test were discussed in detail with Adrian Martin and he agrees to proceed.      Medication Adjustments/Labs and Tests  Ordered: Current medicines are reviewed at length with the patient today.  Concerns regarding medicines are outlined above.  No orders of the defined types were placed in this encounter.  No orders of the defined types were placed in this encounter.   Patient Instructions  Medication Instructions:  Your provider recommends that you continue on your current medications as directed. Please refer to the Current Medication list given to you today.   *If you need a refill on your cardiac medications before your next appointment, please call your pharmacy*  Lab Work: TODAY! ESR, high sensitivity CRP If you have labs (blood work) drawn today and your tests are completely normal, you will receive your results only by: Marland Kitchen MyChart Message (if you have MyChart) OR . A paper  copy in the mail If you have any lab test that is abnormal or we need to change your treatment, we will call you to review the results.  Testing/Procedures: Your provider has requested that you have an exercise tolerance test. For further information please visit [email or web address]. Please also follow instruction sheet, as given. You will need a Covid screening prior to this procedure. COVID SCREENING INFORMATION: Pre-Procedural COVID-19 Testing Site [phone] [place], [place] [address] You will need to go home after your screening and quarantine until your procedure.   Follow-Up: At [facility], you and your health needs are our priority.  As part of our continuing mission to provide you with exceptional heart care, we have created designated Provider Care Teams.  These Care Teams include your primary Cardiologist (physician) and Advanced Practice Providers (APPs -  Physician Assistants and Nurse Practitioners) who all work together to provide you with the care you need, when you need it. Your next appointment:   6 month(s) The format for your next appointment:   In Person Provider:   You may see Werner Lean, MD or one of the following Advanced Practice Providers on your designated Care Team:    Melina Copa, PA-C  Ermalinda Barrios, PA-C      Signed, Werner Lean, MD  05/10/2020 9:35 AM    [place]

## 2020-05-10 NOTE — Patient Instructions (Signed)
Medication Instructions:  Your provider recommends that you continue on your current medications as directed. Please refer to the Current Medication list given to you today.   *If you need a refill on your cardiac medications before your next appointment, please call your pharmacy*  Lab Work: TODAY! ESR, high sensitivity CRP If you have labs (blood work) drawn today and your tests are completely normal, you will receive your results only by: Marland Kitchen MyChart Message (if you have MyChart) OR . A paper copy in the mail If you have any lab test that is abnormal or we need to change your treatment, we will call you to review the results.  Testing/Procedures: Your provider has requested that you have an exercise tolerance test. For further information please visit [email or web address]. Please also follow instruction sheet, as given. You will need a Covid screening prior to this procedure. COVID SCREENING INFORMATION: Pre-Procedural COVID-19 Testing Site [phone] [place], [place] [address] You will need to go home after your screening and quarantine until your procedure.   Follow-Up: At [facility], you and your health needs are our priority.  As part of our continuing mission to provide you with exceptional heart care, we have created designated Provider Care Teams.  These Care Teams include your primary Cardiologist (physician) and Advanced Practice Providers (APPs -  Physician Assistants and Nurse Practitioners) who all work together to provide you with the care you need, when you need it. Your next appointment:   6 month(s) The format for your next appointment:   In Person Provider:   You may see Werner Lean, MD or one of the following Advanced Practice Providers on your designated Care Team:    Melina Copa, PA-C  Ermalinda Barrios, PA-C

## 2020-05-10 NOTE — Addendum Note (Signed)
Addended by: Gunnar Fusi A on: 05/10/2020 10:26 AM   Modules accepted: Orders

## 2020-05-10 NOTE — Progress Notes (Signed)
The patient left Clinic before getting his labs drawn. New orders placed for ESR and CRP to be drawn 2/17 when scheduled for [facility].

## 2020-05-11 LAB — HIGH SENSITIVITY CRP: CRP, High Sensitivity: 0.39 mg/L (ref 0.00–3.00)

## 2020-05-12 NOTE — Addendum Note (Signed)
Addended by: Winifred Olive on: 05/12/2020 01:59 PM   Modules accepted: Orders

## 2020-05-29 ENCOUNTER — Other Ambulatory Visit (HOSPITAL_COMMUNITY)
Admission: RE | Admit: 2020-05-29 | Discharge: 2020-05-29 | Disposition: A | Discharge status: Home / Self Care | Payer: Medicaid Other | Source: Ambulatory Visit | Attending: Internal Medicine | Admitting: Internal Medicine

## 2020-05-29 DIAGNOSIS — Z20822 Contact with and (suspected) exposure to covid-19: Secondary | ICD-10-CM | POA: Diagnosis not present

## 2020-05-29 DIAGNOSIS — Z01812 Encounter for preprocedural laboratory examination: Secondary | ICD-10-CM | POA: Diagnosis not present

## 2020-05-29 LAB — SARS CORONAVIRUS 2 (TAT 6-24 HRS): SARS Coronavirus 2: NEGATIVE

## 2020-06-01 ENCOUNTER — Ambulatory Visit (INDEPENDENT_AMBULATORY_CARE_PROVIDER_SITE_OTHER): Payer: Medicaid Other

## 2020-06-01 ENCOUNTER — Other Ambulatory Visit: Payer: Medicaid Other

## 2020-06-01 ENCOUNTER — Other Ambulatory Visit: Payer: Self-pay

## 2020-06-01 DIAGNOSIS — R079 Chest pain, unspecified: Secondary | ICD-10-CM

## 2020-06-01 LAB — EXERCISE TOLERANCE TEST
Estimated workload: 13.7 METS
Exercise duration (min): 12 min
Exercise duration (sec): 0 s
MPHR: 196 {beats}/min
Peak HR: 179 {beats}/min
Percent HR: 91 %
Rest HR: 88 {beats}/min

## 2021-03-09 ENCOUNTER — Other Ambulatory Visit: Payer: Self-pay

## 2021-03-09 ENCOUNTER — Encounter (HOSPITAL_COMMUNITY): Payer: Self-pay

## 2021-03-09 ENCOUNTER — Emergency Department (HOSPITAL_COMMUNITY)
Admission: EM | Admit: 2021-03-09 | Discharge: 2021-03-09 | Disposition: A | Discharge status: Home / Self Care | Payer: Medicaid Other | Attending: Emergency Medicine | Admitting: Emergency Medicine

## 2021-03-09 DIAGNOSIS — S0990XA Unspecified injury of head, initial encounter: Secondary | ICD-10-CM

## 2021-03-09 DIAGNOSIS — Z23 Encounter for immunization: Secondary | ICD-10-CM | POA: Insufficient documentation

## 2021-03-09 DIAGNOSIS — F17290 Nicotine dependence, other tobacco product, uncomplicated: Secondary | ICD-10-CM | POA: Insufficient documentation

## 2021-03-09 DIAGNOSIS — S0102XA Laceration with foreign body of scalp, initial encounter: Secondary | ICD-10-CM | POA: Insufficient documentation

## 2021-03-09 DIAGNOSIS — S0181XA Laceration without foreign body of other part of head, initial encounter: Secondary | ICD-10-CM

## 2021-03-09 MED ORDER — TETANUS-DIPHTH-ACELL PERTUSSIS 5-2.5-18.5 LF-MCG/0.5 IM SUSY
0.5000 mL | PREFILLED_SYRINGE | Freq: Once | INTRAMUSCULAR | Status: AC
  Administered 2021-03-09: 0.5 mL via INTRAMUSCULAR
  Filled 2021-03-09: qty 0.5

## 2021-03-09 MED ORDER — LIDOCAINE-EPINEPHRINE (PF) 2 %-1:200000 IJ SOLN
10.0000 mL | Freq: Once | INTRAMUSCULAR | Status: AC
  Administered 2021-03-09: 10 mL
  Filled 2021-03-09: qty 20
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
## 2021-03-09 NOTE — ED Notes (Signed)
Steri strips applied per Dr. Renaye Rakers.
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
## 2021-03-09 NOTE — ED Notes (Addendum)
An After Visit Summary was printed and given to the patient. Discharge instructions given and no further questions at this time.  Pt states friend is driving him home.  

## 2021-03-09 NOTE — Discharge Instructions (Signed)
You had 7 stitches placed in your larger wound, one stitch placed in the smaller wound sided.  The stitches need to be removed in 5 days (Dec 1st or 2nd).  This can be done at your doctor's office or at an urgent care, does not require the emergency department.  Keep your wound dry for the next 48 hours.  After that you can shower normally.  Do not rub or pull your stitches.  Do not put any ointment on your stitches.  You can leave the Steri-Strips on until they fall off on their own.  We talked about the likelihood that there will be some scar forming here.  The scarring process takes many months to complete.  The way the wound looks in 1 week will not be the way it looks several months from now.  You can reduce your scar formation by keeping this wound covered for at least 3 months when you go outside, either with a hat or a loose bandage, to avoid direct sunlight (UV light) on it.  We also removed a small piece of glass from one of your wounds.  I did not see any other glass left in the wound.  If you start having new redness spreading across your forehead, or pus coming from the wound, please come back to the ER immediately.  These may be signs of an infection or a small glass piece left in the wound.
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
## 2021-03-16 ENCOUNTER — Ambulatory Visit (HOSPITAL_COMMUNITY)
Admission: EM | Admit: 2021-03-16 | Discharge: 2021-03-16 | Disposition: A | Discharge status: Home / Self Care | Payer: Medicaid Other

## 2021-03-16 ENCOUNTER — Other Ambulatory Visit: Payer: Self-pay

## 2022-04-06 IMAGING — CR DG CHEST 2V
2 series · 2 of 2 positions shown · non-contrast
Comparison: 01/07/2017

CLINICAL DATA: Chest pain.

EXAM:
CHEST - 2 VIEW

[chest pa]
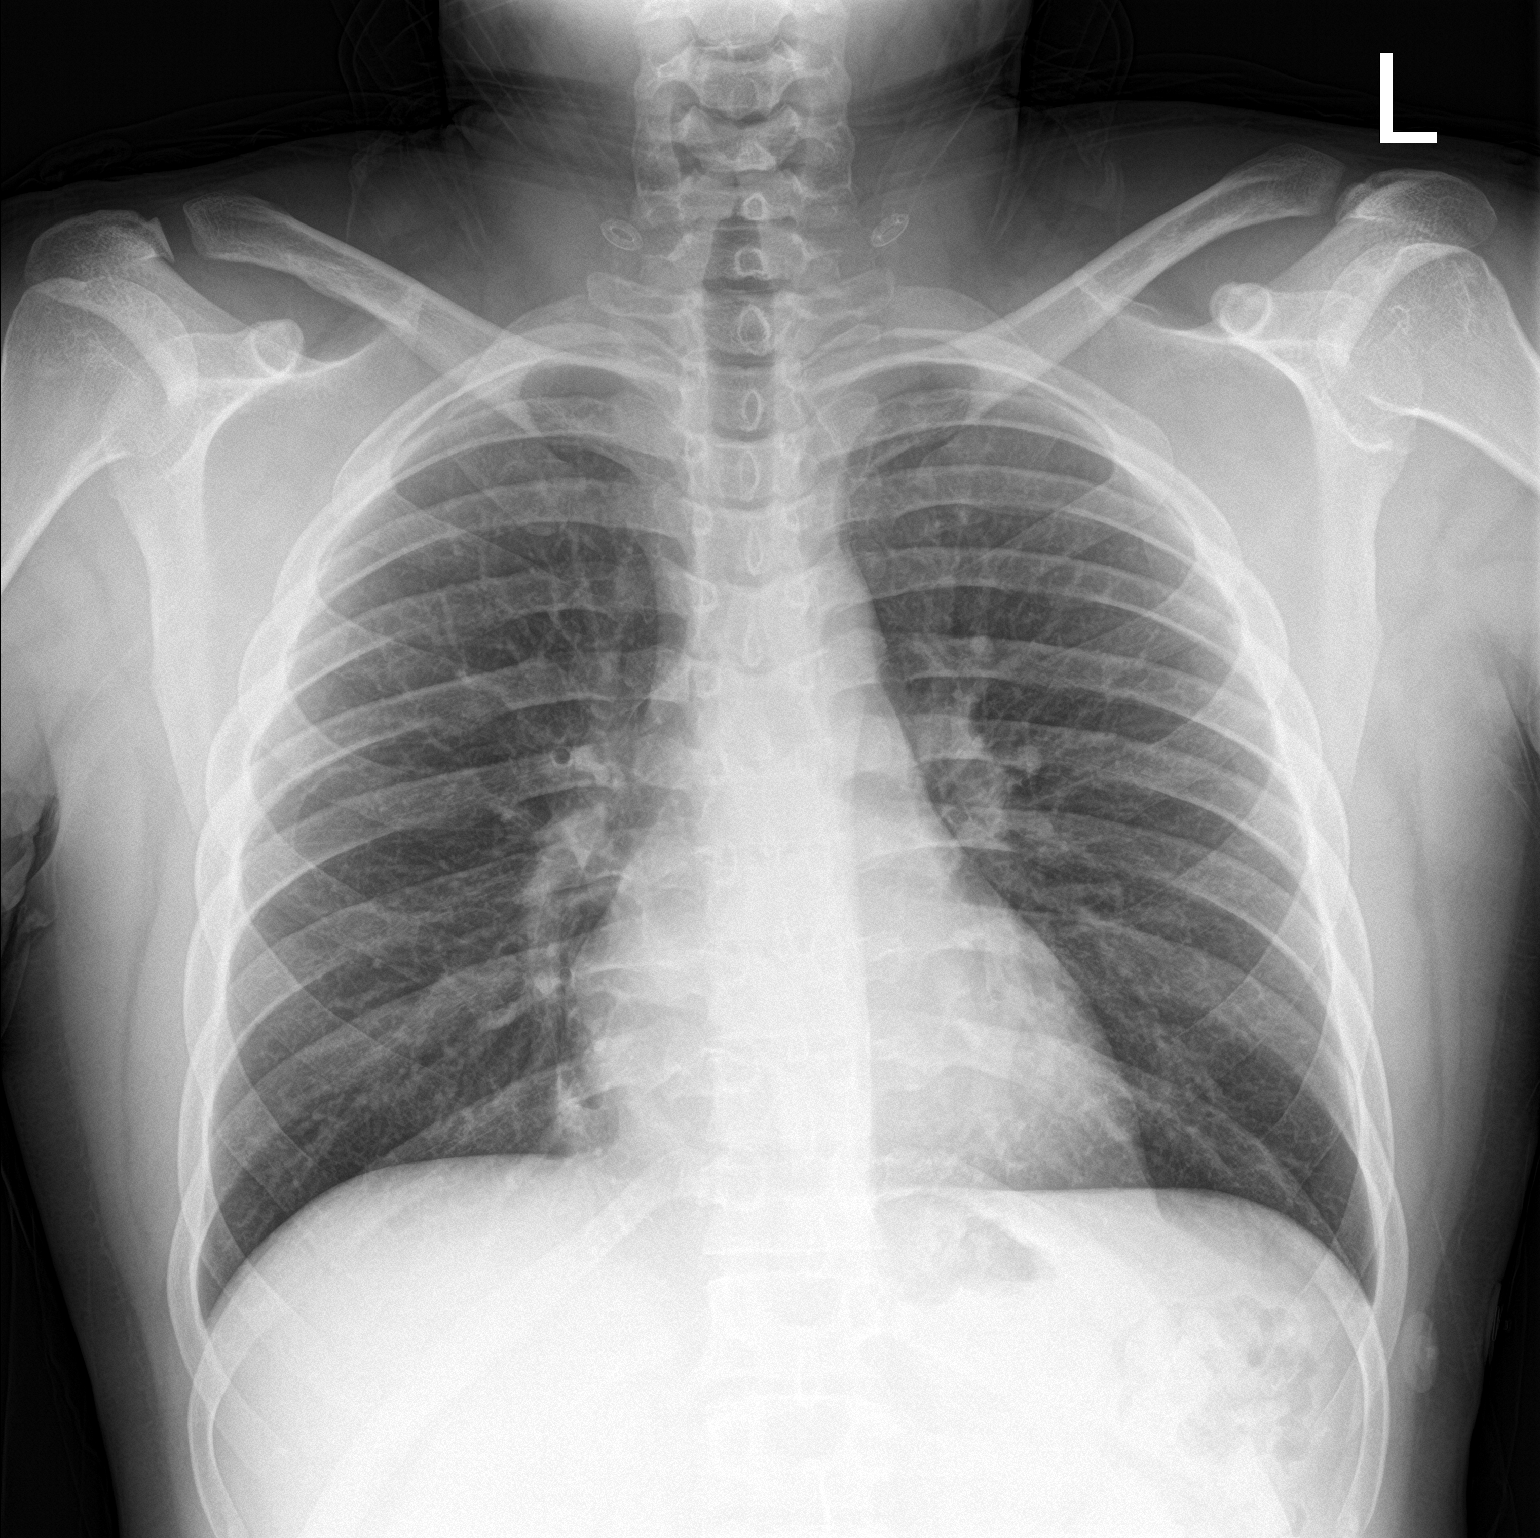

[chest lat]
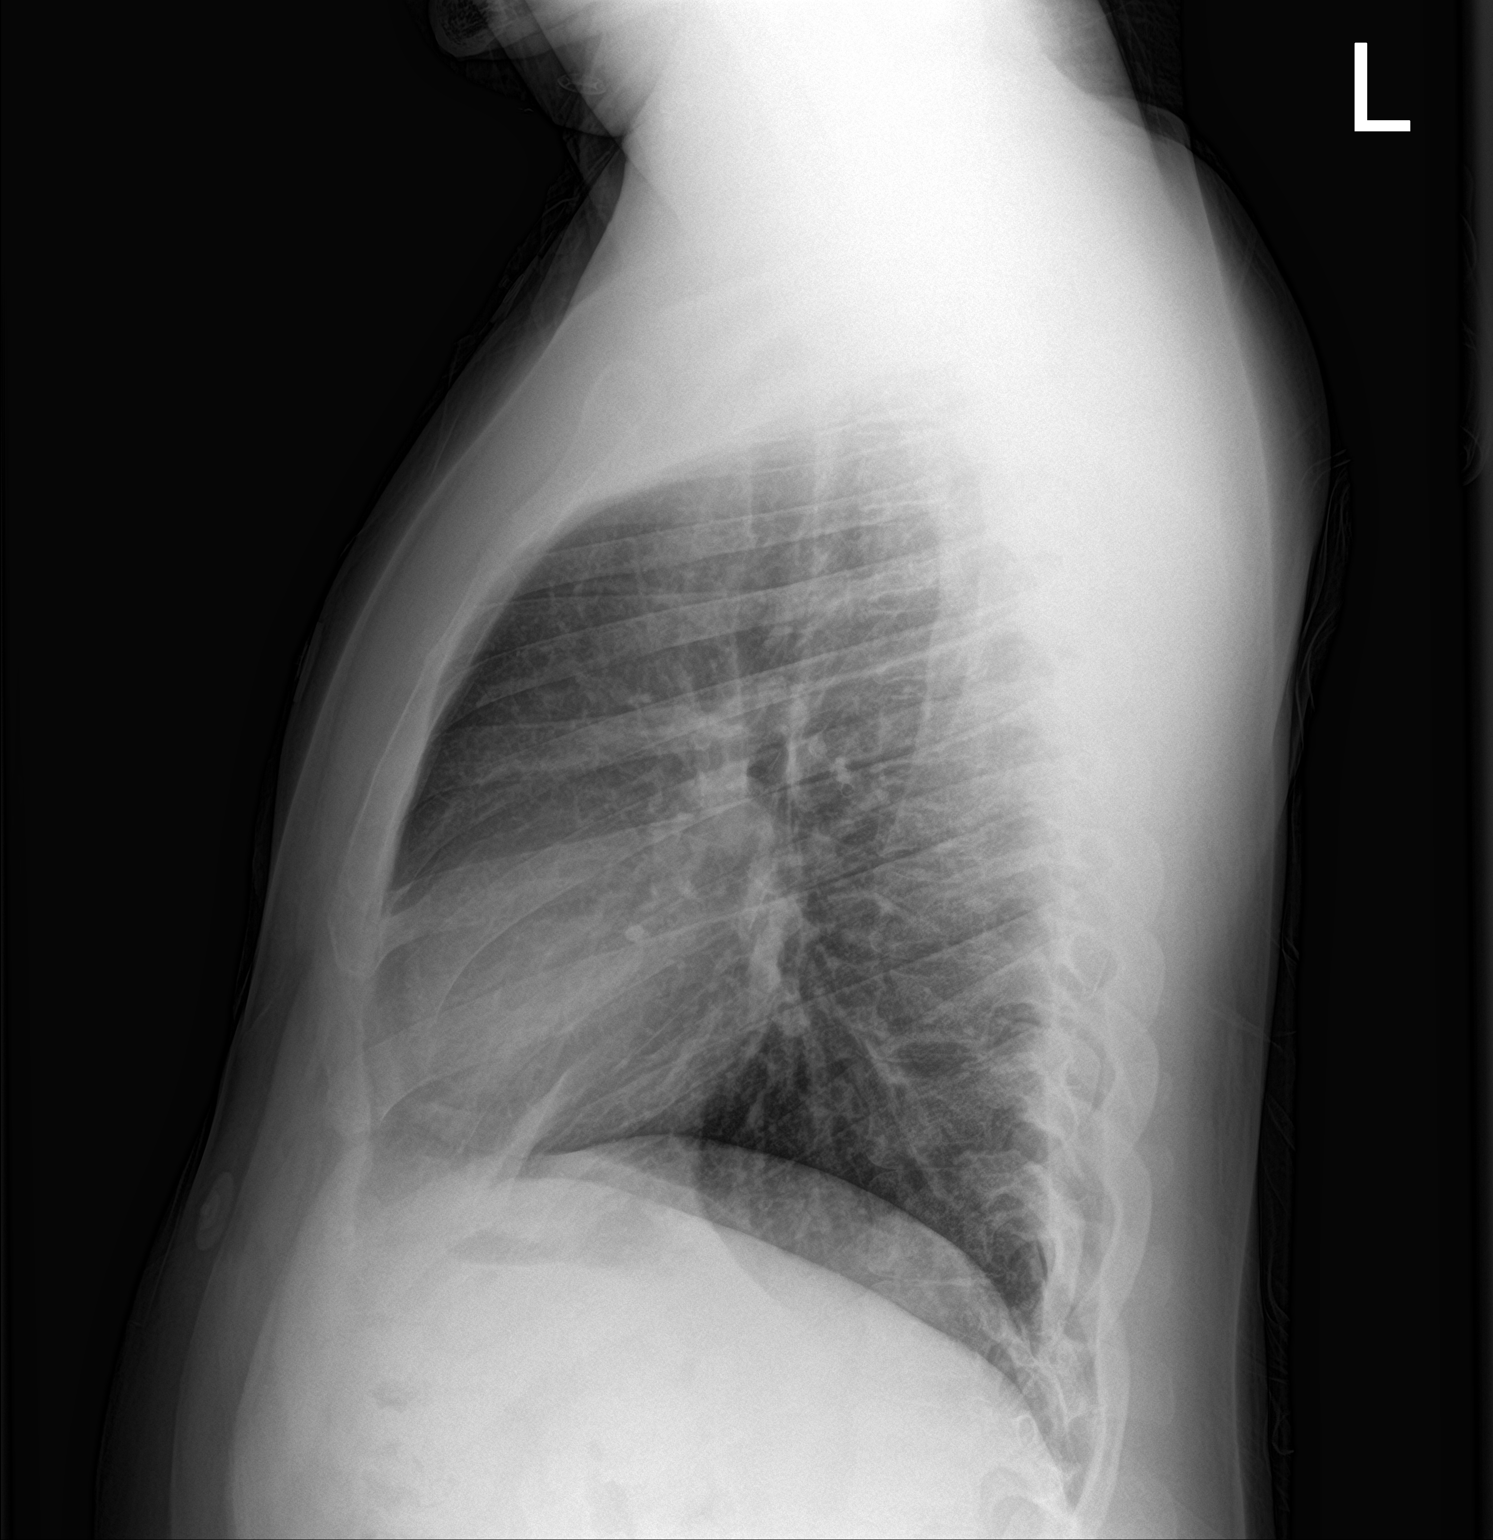

[2 of 2 positions shown; findings below may reference images not displayed]

FINDINGS: The heart size and mediastinal contours are within normal limits. No
consolidation. Small granuloma at the right lung base better seen on
prior. No pleural effusions or pneumothorax. The visualized skeletal
structures are unremarkable.
IMPRESSION: No acute cardiopulmonary disease.

## 2022-08-27 ENCOUNTER — Other Ambulatory Visit: Payer: Self-pay

## 2022-08-27 ENCOUNTER — Emergency Department (HOSPITAL_COMMUNITY)
Admission: EM | Admit: 2022-08-27 | Discharge: 2022-08-28 | Disposition: A | Discharge status: Home / Self Care | Payer: Medicaid Other | Attending: Emergency Medicine | Admitting: Emergency Medicine

## 2022-08-27 ENCOUNTER — Encounter (HOSPITAL_COMMUNITY): Payer: Self-pay

## 2022-08-27 DIAGNOSIS — T50901A Poisoning by unspecified drugs, medicaments and biological substances, accidental (unintentional), initial encounter: Secondary | ICD-10-CM | POA: Insufficient documentation

## 2022-08-27 DIAGNOSIS — T6591XA Toxic effect of unspecified substance, accidental (unintentional), initial encounter: Secondary | ICD-10-CM

## 2022-08-27 DIAGNOSIS — E876 Hypokalemia: Secondary | ICD-10-CM | POA: Diagnosis not present

## 2022-08-27 DIAGNOSIS — R4182 Altered mental status, unspecified: Secondary | ICD-10-CM

## 2022-08-27 LAB — COMPREHENSIVE METABOLIC PANEL
ALT: 25 U/L (ref 0–44)
AST: 24 U/L (ref 15–41)
Albumin: 4.5 g/dL (ref 3.5–5.0)
Alkaline Phosphatase: 55 U/L (ref 38–126)
Anion gap: 16 — ABNORMAL HIGH (ref 5–15)
BUN: 7 mg/dL (ref 6–20)
CO2: 19 mmol/L — ABNORMAL LOW (ref 22–32)
Calcium: 8.9 mg/dL (ref 8.9–10.3)
Chloride: 108 mmol/L (ref 98–111)
Creatinine, Ser: 0.99 mg/dL (ref 0.61–1.24)
GFR, Estimated: >60 mL/min (ref >60)
Glucose, Bld: 122 mg/dL — ABNORMAL HIGH (ref 70–99)
Potassium: 3.3 mmol/L — ABNORMAL LOW (ref 3.5–5.1)
Sodium: 143 mmol/L (ref 135–145)
Total Bilirubin: 0.7 mg/dL (ref 0.3–1.2)
Total Protein: 8.1 g/dL (ref 6.5–8.1)

## 2022-08-27 LAB — CBC WITH DIFFERENTIAL/PLATELET
Abs Immature Granulocytes: 0.01 10*3/uL (ref 0.00–0.07)
Basophils Absolute: 0.1 10*3/uL (ref 0.0–0.1)
Basophils Relative: 1 %
Eosinophils Absolute: 0 10*3/uL (ref 0.0–0.5)
Eosinophils Relative: 0 %
HCT: 41.8 % (ref 39.0–52.0)
Hemoglobin: 14 g/dL (ref 13.0–17.0)
Immature Granulocytes: 0 %
Lymphocytes Relative: 46 %
Lymphs Abs: 3.6 10*3/uL (ref 0.7–4.0)
MCH: 31.4 pg (ref 26.0–34.0)
MCHC: 33.5 g/dL (ref 30.0–36.0)
MCV: 93.7 fL (ref 80.0–100.0)
Monocytes Absolute: 0.6 10*3/uL (ref 0.1–1.0)
Monocytes Relative: 7 %
Neutro Abs: 3.6 10*3/uL (ref 1.7–7.7)
Neutrophils Relative %: 46 %
Platelets: 387 10*3/uL (ref 150–400)
RBC: 4.46 MIL/uL (ref 4.22–5.81)
RDW: 12.8 % (ref 11.5–15.5)
WBC: 7.9 10*3/uL (ref 4.0–10.5)
nRBC: 0 % (ref 0.0–0.2)

## 2022-08-27 LAB — ETHANOL: Alcohol, Ethyl (B): 62 mg/dL — ABNORMAL HIGH (ref <10)
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
## 2022-12-10 ENCOUNTER — Encounter (HOSPITAL_COMMUNITY): Payer: Self-pay | Admitting: Emergency Medicine

## 2022-12-10 ENCOUNTER — Ambulatory Visit (HOSPITAL_COMMUNITY)
Admission: EM | Admit: 2022-12-10 | Discharge: 2022-12-10 | Disposition: A | Discharge status: Home / Self Care | Payer: Medicaid Other | Attending: Family Medicine | Admitting: Family Medicine

## 2022-12-10 DIAGNOSIS — R079 Chest pain, unspecified: Secondary | ICD-10-CM | POA: Diagnosis not present

## 2022-12-10 MED ORDER — IBUPROFEN 800 MG PO TABS: 800.0000 mg | ORAL_TABLET | Freq: Three times a day (TID) | ORAL | 0 refills | Status: AC | PRN

## 2022-12-10 NOTE — ED Triage Notes (Signed)
Pt c/o intermittent generalized chest pains that has been for past several days that is positional. Reports hasn't gone to work due to the pains. Pt hasn't tried anything to help with pain.

## 2022-12-10 NOTE — Discharge Instructions (Signed)
Take ibuprofen 800 mg--1 tab every 8 hours as needed for pain.   

## 2022-12-10 NOTE — ED Provider Notes (Signed)
[facility]    CSN: [id number] Arrival date & time: 12/10/22  1702      History   Chief Complaint Chief Complaint  Patient presents with   Chest Pain    HPI Adrian Martin is a 27 y.o. male.    Chest Pain Here for pain or tightness in his anterior chest.  Is been bothering him for 4 or 5 days.  It seems to be a little worse when he lies back on his back. No fever or cough or congestion.  No shortness of breath.  Movement has not really exacerbated it nor does taking a deep breath.  He has had this happen before.  He tried to go back to work today and they wanted him to be evaluated before he returned to work.   Past Medical History:  Diagnosis Date   Chest pain     Patient Active Problem List   Diagnosis Date Noted   Chest pain of uncertain etiology 05/10/2020    Past Surgical History:  Procedure Laterality Date   ADENOIDECTOMY     TONSILLECTOMY         Home Medications    Prior to Admission medications   Medication Sig Start Date End Date Taking? Authorizing Provider  ibuprofen (ADVIL) 800 MG tablet Take 1 tablet (800 mg total) by mouth every 8 (eight) hours as needed (pain). 12/10/22  Yes Zenia Resides, MD  acetaminophen (TYLENOL) 500 MG tablet Take 1,000 mg by mouth as needed for moderate pain or mild pain.    [provider]    Family History Family History  Problem Relation Age of Onset   Hypertension Mother    Hypertension Father     Social History Social History   Tobacco Use   Smoking status: Every Day    Types: Cigars   Smokeless tobacco: Never   Tobacco comments:    black and milds 2-3   Vaping Use   Vaping status: Never Used  Substance Use Topics   Alcohol use: Yes    Comment: occasinally   Drug use: Not Currently    Types: Marijuana    Comment: 3-4 times a week     Allergies   Patient has no known allergies.   Review of Systems Review of Systems  Cardiovascular:  Positive for chest pain.      Physical Exam Triage Vital Signs ED Triage Vitals  Encounter Vitals Group     BP 12/10/22 1857 137/84     Systolic BP Percentile --      Diastolic BP Percentile --      Pulse Rate 12/10/22 1857 81     Resp 12/10/22 1857 14     Temp 12/10/22 1857 98.4 F (36.9 C)     Temp Source 12/10/22 1857 Oral     SpO2 12/10/22 1857 98 %     Weight --      Height --      Head Circumference --      Peak Flow --      Pain Score 12/10/22 1858 7     Pain Loc --      Pain Education --      Exclude from Growth Chart --    No data found.  Updated Vital Signs BP 137/84 (BP Location: Left Arm)   Pulse 81   Temp 98.4 F (36.9 C) (Oral)   Resp 14   SpO2 98%   Visual Acuity Right Eye Distance:   Left Eye  Distance:   Bilateral Distance:    Right Eye Near:   Left Eye Near:    Bilateral Near:     Physical Exam Vitals reviewed.  Constitutional:      General: He is not in acute distress.    Appearance: He is not toxic-appearing.  HENT:     Nose: Nose normal. No congestion or rhinorrhea.     Mouth/Throat:     Mouth: Mucous membranes are moist.     Pharynx: No oropharyngeal exudate or posterior oropharyngeal erythema.  Eyes:     Extraocular Movements: Extraocular movements intact.     Conjunctiva/sclera: Conjunctivae normal.     Pupils: Pupils are equal, round, and reactive to light.  Cardiovascular:     Rate and Rhythm: Normal rate and regular rhythm.     Heart sounds: No murmur heard. Pulmonary:     Effort: Pulmonary effort is normal. No respiratory distress.     Breath sounds: Normal breath sounds. No stridor. No wheezing, rhonchi or rales.  Chest:     Chest wall: No tenderness.  Musculoskeletal:     Cervical back: Neck supple.  Lymphadenopathy:     Cervical: No cervical adenopathy.  Skin:    Capillary Refill: Capillary refill takes less than 2 seconds.     Coloration: Skin is not jaundiced or pale.  Neurological:     General: No focal deficit present.     Mental  Status: He is alert and oriented to person, place, and time.  Psychiatric:        Behavior: Behavior normal.      UC Treatments / Results  Labs (all labs ordered are listed, but only abnormal results are displayed) Labs Reviewed - No data to display  EKG   Radiology No results found.  Procedures Procedures (including critical care time)  Medications Ordered in UC Medications - No data to display  Initial Impression / Assessment and Plan / UC Course  I have reviewed the triage vital signs and the nursing notes.  Pertinent labs & imaging results that were available during my care of the patient were reviewed by me and considered in my medical decision making (see chart for details).        Ibuprofen is sent in to treat the pain.  He feels that he is ready to go back to work.  Since movement is not exacerbating the pain, though it does still seem to be musculoskeletal in origin, I think he is safe to go back to work. Final Clinical Impressions(s) / UC Diagnoses   Final diagnoses:  Chest pain, unspecified type     Discharge Instructions      Take ibuprofen 800 mg--1 tab every 8 hours as needed for pain.       ED Prescriptions     Medication Sig Dispense Auth. Provider   ibuprofen (ADVIL) 800 MG tablet Take 1 tablet (800 mg total) by mouth every 8 (eight) hours as needed (pain). 21 tablet Zea Kostka, Janace Aris, MD      PDMP not reviewed this encounter.   Zenia Resides, MD 12/10/22 [phone]

## 2023-11-28 ENCOUNTER — Emergency Department (HOSPITAL_COMMUNITY): Payer: Self-pay

## 2023-11-28 ENCOUNTER — Other Ambulatory Visit: Payer: Self-pay

## 2023-11-28 ENCOUNTER — Emergency Department (HOSPITAL_COMMUNITY)
Admission: EM | Admit: 2023-11-28 | Discharge: 2023-11-28 | Disposition: A | Discharge status: Home / Self Care | Payer: Self-pay | Attending: Emergency Medicine | Admitting: Emergency Medicine

## 2023-11-28 DIAGNOSIS — R000 Tachycardia, unspecified: Secondary | ICD-10-CM | POA: Insufficient documentation

## 2023-11-28 DIAGNOSIS — F1010 Alcohol abuse, uncomplicated: Secondary | ICD-10-CM | POA: Insufficient documentation

## 2023-11-28 DIAGNOSIS — Y906 Blood alcohol level of 120-199 mg/100 ml: Secondary | ICD-10-CM | POA: Insufficient documentation

## 2023-11-28 LAB — CBC WITH DIFFERENTIAL/PLATELET
Abs Immature Granulocytes: 0.02 K/uL (ref 0.00–0.07)
Basophils Absolute: 0 K/uL (ref 0.0–0.1)
Basophils Relative: 0 %
Eosinophils Absolute: 0 K/uL (ref 0.0–0.5)
Eosinophils Relative: 0 %
HCT: 41.3 % (ref 39.0–52.0)
Hemoglobin: 13.7 g/dL (ref 13.0–17.0)
Immature Granulocytes: 0 %
Lymphocytes Relative: 17 %
Lymphs Abs: 1.8 K/uL (ref 0.7–4.0)
MCH: 32.1 pg (ref 26.0–34.0)
MCHC: 33.2 g/dL (ref 30.0–36.0)
MCV: 96.7 fL (ref 80.0–100.0)
Monocytes Absolute: 0.4 K/uL (ref 0.1–1.0)
Monocytes Relative: 3 %
Neutro Abs: 8.4 K/uL — ABNORMAL HIGH (ref 1.7–7.7)
Neutrophils Relative %: 80 %
Platelets: 397 K/uL (ref 150–400)
RBC: 4.27 MIL/uL (ref 4.22–5.81)
RDW: 12.7 % (ref 11.5–15.5)
WBC: 10.6 K/uL — ABNORMAL HIGH (ref 4.0–10.5)
nRBC: 0 % (ref 0.0–0.2)

## 2023-11-28 LAB — COMPREHENSIVE METABOLIC PANEL WITH GFR
ALT: 24 U/L (ref 0–44)
AST: 26 U/L (ref 15–41)
Albumin: 4.9 g/dL (ref 3.5–5.0)
Alkaline Phosphatase: 64 U/L (ref 38–126)
Anion gap: 19 — ABNORMAL HIGH (ref 5–15)
BUN: 13 mg/dL (ref 6–20)
CO2: 22 mmol/L (ref 22–32)
Calcium: 9.4 mg/dL (ref 8.9–10.3)
Chloride: 104 mmol/L (ref 98–111)
Creatinine, Ser: 0.95 mg/dL (ref 0.61–1.24)
GFR, Estimated: >60 mL/min (ref >60)
Glucose, Bld: 127 mg/dL — ABNORMAL HIGH (ref 70–99)
Potassium: 3.7 mmol/L (ref 3.5–5.1)
Sodium: 145 mmol/L (ref 135–145)
Total Bilirubin: 0.8 mg/dL (ref 0.0–1.2)
Total Protein: 8.8 g/dL — ABNORMAL HIGH (ref 6.5–8.1)

## 2023-11-28 LAB — ETHANOL: Alcohol, Ethyl (B): 173 mg/dL — ABNORMAL HIGH (ref <15)

## 2023-11-28 LAB — LIPASE, BLOOD: Lipase: 26 U/L (ref 11–51)

## 2023-11-28 MED ORDER — HALOPERIDOL LACTATE 5 MG/ML IJ SOLN
5.0000 mg | Freq: Once | INTRAMUSCULAR | Status: AC
Start: 2023-11-28 — End: 2023-11-28
  Administered 2023-11-28: 5 mg via INTRAVENOUS
  Filled 2023-11-28: qty 1

## 2023-11-28 MED ORDER — HALOPERIDOL LACTATE 5 MG/ML IJ SOLN
5.0000 mg | Freq: Once | INTRAMUSCULAR | Status: AC
  Administered 2023-11-28: 5 mg via INTRAVENOUS
  Filled 2023-11-28: qty 1

## 2023-11-28 MED ORDER — SODIUM CHLORIDE 0.9 % IV BOLUS
1000.0000 mL | Freq: Once | INTRAVENOUS | Status: AC
  Administered 2023-11-28: 1000 mL via INTRAVENOUS

## 2023-11-28 NOTE — ED Notes (Signed)
 CT personnel report that pt would not lie down for scan, and was unsafe.

## 2023-11-28 NOTE — ED Provider Notes (Signed)
 [place] [facility] AT [facility] Provider Note   CSN: [id number] Arrival date & time: 11/28/23  1713     Patient presents with: Altered Mental Status   Nellie Port is a 28 y.o. male.   Level 5 caveat.  History provided by EMS.  Patient was found unresponsive by bystanders sitting down wearing [employer] uniform at a bus stop.  Normal blood sugar with EMS.  Dilated pupils.  Little bit of agitation on exam with EMS.  But he had normal vitals.  No Narcan was given.  The history is provided by the EMS personnel.       Prior to Admission medications   Medication Sig Start Date End Date Taking? Authorizing Provider  acetaminophen (TYLENOL) 500 MG tablet Take 1,000 mg by mouth as needed for moderate pain or mild pain.    [provider]  ibuprofen  (ADVIL ) 800 MG tablet Take 1 tablet (800 mg total) by mouth every 8 (eight) hours as needed (pain). 12/10/22   Banister, Pamela K, MD    Allergies: Patient has no known allergies.    Review of Systems  Updated Vital Signs BP 117/79   Pulse (!) 110   Temp 98.4 F (36.9 C) (Oral)   Resp 18   SpO2 98%   Physical Exam Constitutional:      General: He is not in acute distress.    Appearance: He is not ill-appearing.  HENT:     Head: Normocephalic and atraumatic.     Nose: Nose normal.  Eyes:     Comments: Dilated pupils bilaterally   Cardiovascular:     Rate and Rhythm: Tachycardia present.  Pulmonary:     Effort: Pulmonary effort is normal.     Breath sounds: Normal breath sounds.  Abdominal:     General: Abdomen is flat.  Musculoskeletal:        General: No tenderness. Normal range of motion.     Cervical back: Normal range of motion. No tenderness.  Neurological:     General: No focal deficit present.     Comments: Moves all extremities response to pain opens eyes spontaneously  Psychiatric:     Comments: Agitated somewhat on exam     (all labs ordered are listed, but only  abnormal results are displayed) Labs Reviewed  CBC WITH DIFFERENTIAL/PLATELET - Abnormal; Notable for the following components:      Result Value   WBC 10.6 (*)    Neutro Abs 8.4 (*)    All other components within normal limits  COMPREHENSIVE METABOLIC PANEL WITH GFR - Abnormal; Notable for the following components:   Glucose, Bld 127 (*)    Total Protein 8.8 (*)    Anion gap 19 (*)    All other components within normal limits  ETHANOL - Abnormal; Notable for the following components:   Alcohol, Ethyl (B) 173 (*)    All other components within normal limits  LIPASE, BLOOD  RAPID URINE DRUG SCREEN, HOSP PERFORMED    EKG: EKG Interpretation Date/Time:  Friday November 28 2023 17:27:57 EDT Ventricular Rate:  101 PR Interval:  137 QRS Duration:  83 QT Interval:  337 QTC Calculation: 437 R Axis:   57  Text Interpretation: Sinus tachycardia Confirmed by Ruthe Cornet [phone]) on 11/28/2023 5:47:47 PM  Radiology: CT Head Wo Contrast Result Date: 11/28/2023 EXAM: CT HEAD WITHOUT CONTRAST 11/28/2023 09:50:00 PM TECHNIQUE: CT of the head was performed without the administration of intravenous contrast. Automated exposure control, iterative reconstruction,  and/or weight based adjustment of the mA/kV was utilized to reduce the radiation dose to as low as reasonably achievable. COMPARISON: None available. CLINICAL HISTORY: Mental status change, unknown cause. PT arrives via EMS from the [facility]. Pt was found unresponsive by bystanders sitting down wearing [employer] uniform. No obvious signs of injury. FINDINGS: BRAIN AND VENTRICLES: No acute hemorrhage. Gray-white differentiation is preserved. No hydrocephalus. No extra-axial collection. No mass effect or midline shift. ORBITS: No acute abnormality. SINUSES: No acute abnormality. SOFT TISSUES AND SKULL: No acute soft tissue abnormality. No skull fracture. IMPRESSION: 1. No acute intracranial abnormality. Electronically signed  by: Franky Stanford MD 11/28/2023 10:37 PM EDT RP Workstation: [id number]   DG Chest Portable 1 View Result Date: 11/28/2023 CLINICAL DATA:  Altered mental status EXAM: PORTABLE CHEST 1 VIEW COMPARISON:  Chest radiograph January 26, 2020 FINDINGS: The heart size and mediastinal contours are within normal limits. Both lungs are clear. The visualized skeletal structures are unremarkable. IMPRESSION: No active disease. Electronically Signed   By: Megan  Zare M.D.   On: 11/28/2023 17:51     Procedures   Medications Ordered in the ED  sodium chloride  0.9 % bolus 1,000 mL (0 mLs Intravenous Stopped 11/28/23 1907)  haloperidol  lactate (HALDOL ) injection 5 mg (5 mg Intravenous Given 11/28/23 1903)  haloperidol  lactate (HALDOL ) injection 5 mg (5 mg Intravenous Given 11/28/23 2019)                                    Medical Decision Making Amount and/or Complexity of Data Reviewed Labs: ordered. Radiology: ordered.  Risk Prescription drug management.   Cassady Marxen is here with altered mental status.  Unremarkable vitals except for mild tachycardia.  Per chart review looks like he was seen here recently for the same.  Differential diagnosis likely some sort of overdose.  My suspicion is may be for Benadryl  or some sort of medication and along those rounds with dilated pupils.  This does not appear consistent with an opioid overdose.  Could be some sort of benzodiazepine as well.  Sounds like he metabolized and went home.  I do not see any kind of seizure activity.  Does not sound like he has seizure activity with EMS or by bystanders.  His alcohol was mildly elevated last time he was seen for the same.  Ultimately will do metabolic workup get a head CT.  Per my review interpretation labs alcohol level is 173.  My suspicion is that this is substance abuse related.  Will allow him to continue to metabolize.  Lab work is otherwise unremarkable.  Chest x-ray showed no evidence of pneumonia pneumothorax.   Head CT is unremarkable.  Overall alcohol level is 173.  Lab work otherwise unremarkable otherwise.  Patient's more awake.  I was able to talk with his sister on the phone to get her to come here to talk with the patient.  Does not sound like he has any mental health history.  He has been drinking heavily here recently.  Does not sound like he has any seizure history.  No psychiatry history.  He is still not answering questions quite appropriately but he is able to ambulate without any issues.  Overall patient seems to be to his baseline.  His mother came.  He was able to answer questions well with her and with me on reevaluation.  We talked about seeking alcohol rehab treatment.  He was given resources for this.  Patient discharged with his mother.  This chart was dictated using voice recognition software.  Despite best efforts to proofread,  errors can occur which can change the documentation meaning.      Final diagnoses:  ETOH abuse    ED Discharge Orders     None          Ruthe Cornet, DO 11/28/23 2259

## 2023-11-28 NOTE — ED Notes (Signed)
 Pt requested to speak with sister. Pt spoke with sister on the phone without difficulty. Sister on the way to the ED. Pt then started walking down the hallway again. Pt directed back to room and given urinal.

## 2023-11-28 NOTE — ED Notes (Signed)
 Pt climbed out of bed, ripped out IV, and started ambulating through halls. Multiple staff redirected pt to his room. Security called.

## 2023-11-28 NOTE — ED Notes (Signed)
 [facility] 123

## 2023-11-28 NOTE — ED Notes (Signed)
 Pt leaving with mother. Pt ambulatory and walking. Provider placed pt up for discharge. Pt refused to stay in room and obtain discharge vitals.

## 2023-11-28 NOTE — ED Notes (Signed)
 Patient transported to CT and returned to room.

## 2023-11-28 NOTE — ED Triage Notes (Signed)
 PT arrives via EMS from the [facility]. Pt was found unresponsive by bystanders sitting down wearing [employer] uniform. No obvious signs of injury.   Airway intact.  18GA IV placed in LEFT AC by EMS   Glucose 124mg /dL

## 2023-11-28 NOTE — ED Notes (Signed)
 CT attempted pt after haldol  administration. Pt remained uncooperative with CT.

## 2023-11-30 ENCOUNTER — Other Ambulatory Visit: Payer: Self-pay

## 2023-11-30 ENCOUNTER — Encounter (HOSPITAL_COMMUNITY): Payer: Self-pay | Admitting: *Deleted

## 2023-11-30 ENCOUNTER — Ambulatory Visit (HOSPITAL_COMMUNITY)
Admission: EM | Admit: 2023-11-30 | Discharge: 2023-11-30 | Disposition: A | Discharge status: Home / Self Care | Payer: Self-pay | Attending: Physician Assistant | Admitting: Physician Assistant

## 2023-11-30 DIAGNOSIS — H1031 Unspecified acute conjunctivitis, right eye: Secondary | ICD-10-CM

## 2023-11-30 DIAGNOSIS — H11421 Conjunctival edema, right eye: Secondary | ICD-10-CM

## 2023-11-30 MED ORDER — POLYMYXIN B-TRIMETHOPRIM 10000-0.1 UNIT/ML-% OP SOLN
1.0000 [drp] | OPHTHALMIC | 0 refills | Status: AC
Start: 2023-11-30 — End: 2023-12-07

## 2023-11-30 NOTE — ED Triage Notes (Signed)
 PT reports he woke this morning and his RT eye was red . Pt does not wear contacts and he does not remember any injury.

## 2023-11-30 NOTE — ED Notes (Signed)
 PT reported while doing EYE Chart he wore glasses but his glasses are broken. Visual acuity doe with out correction.

## 2023-12-02 NOTE — ED Provider Notes (Signed)
 [facility]    CSN: [id number] Arrival date & time: 11/30/23  1223      History   Chief Complaint Chief Complaint  Patient presents with   Eye Problem    HPI Adrian Martin is a 28 y.o. male.    Eye Problem Associated symptoms: redness   Associated symptoms: no discharge, no numbness and no photophobia     Past Medical History:  Diagnosis Date   Chest pain     Patient Active Problem List   Diagnosis Date Noted   Chest pain of uncertain etiology 05/10/2020    Past Surgical History:  Procedure Laterality Date   ADENOIDECTOMY     TONSILLECTOMY         Home Medications    Prior to Admission medications   Medication Sig Start Date End Date Taking? Authorizing Provider  acetaminophen (TYLENOL) 500 MG tablet Take 1,000 mg by mouth as needed for moderate pain or mild pain.   Yes [provider]  ibuprofen  (ADVIL ) 800 MG tablet Take 1 tablet (800 mg total) by mouth every 8 (eight) hours as needed (pain). 12/10/22  Yes Vonna Sharlet POUR, MD  trimethoprim -polymyxin b  (POLYTRIM ) ophthalmic solution Place 1 drop into the right eye every 4 (four) hours for 7 days. 11/30/23 12/07/23 Yes Billy Asberry FALCON, PA-C    Family History Family History  Problem Relation Age of Onset   Hypertension Mother    Hypertension Father     Social History Social History   Tobacco Use   Smoking status: Every Day    Types: Cigars   Smokeless tobacco: Never   Tobacco comments:    black and milds 2-3   Vaping Use   Vaping status: Never Used  Substance Use Topics   Alcohol use: Yes    Comment: occasinally   Drug use: Not Currently    Types: Marijuana    Comment: 3-4 times a week     Allergies   Patient has no known allergies.   Review of Systems Review of Systems  Constitutional:  Negative for chills and fever.  Eyes:  Positive for redness. Negative for photophobia, pain, discharge and visual disturbance.  Neurological:  Negative for numbness.      Physical Exam Triage Vital Signs ED Triage Vitals  Encounter Vitals Group     BP 11/30/23 1418 (!) 157/84     Girls Systolic BP Percentile --      Girls Diastolic BP Percentile --      Boys Systolic BP Percentile --      Boys Diastolic BP Percentile --      Pulse Rate 11/30/23 1418 82     Resp 11/30/23 1418 18     Temp 11/30/23 1418 98.4 F (36.9 C)     Temp src --      SpO2 11/30/23 1418 98 %     Weight --      Height --      Head Circumference --      Peak Flow --      Pain Score 11/30/23 1417 0     Pain Loc --      Pain Education --      Exclude from Growth Chart --    No data found.  Updated Vital Signs BP (!) 157/84   Pulse 82   Temp 98.4 F (36.9 C)   Resp 18   SpO2 98%   Visual Acuity Right Eye Distance: 20/50 with out correction Left Eye Distance: 20/50  with out correction Bilateral Distance: 20/50 with out corection  Right Eye Near:   Left Eye Near:    Bilateral Near:     Physical Exam Vitals and nursing note reviewed.  Constitutional:      General: He is not in acute distress.    Appearance: Normal appearance. He is not ill-appearing.  HENT:     Head: Normocephalic and atraumatic.  Eyes:     Comments: Right conjunctiva injected with chemosis noted- worse to lower outer corner, normal left conjunctiva  Cardiovascular:     Rate and Rhythm: Normal rate.  Pulmonary:     Effort: Pulmonary effort is normal. No respiratory distress.  Neurological:     Mental Status: He is alert.  Psychiatric:        Mood and Affect: Mood normal.        Behavior: Behavior normal.        Thought Content: Thought content normal.      UC Treatments / Results  Labs (all labs ordered are listed, but only abnormal results are displayed) Labs Reviewed - No data to display  EKG   Radiology No results found.  Procedures Procedures (including critical care time)  Medications Ordered in UC Medications - No data to display  Initial Impression /  Assessment and Plan / UC Course  I have reviewed the triage vital signs and the nursing notes.  Pertinent labs & imaging results that were available during my care of the patient were reviewed by me and considered in my medical decision making (see chart for details).    Will treat to cover conjunctivitis with Polytrim  drops.  Advised further evaluation in the emergency room with any worsening.  Final Clinical Impressions(s) / UC Diagnoses   Final diagnoses:  Acute conjunctivitis of right eye, unspecified acute conjunctivitis type  Chemosis, right   Discharge Instructions   None    ED Prescriptions     Medication Sig Dispense Auth. Provider   trimethoprim -polymyxin b  (POLYTRIM ) ophthalmic solution Place 1 drop into the right eye every 4 (four) hours for 7 days. 10 mL Billy Asberry FALCON, PA-C      PDMP not reviewed this encounter.   Billy Asberry FALCON, PA-C 12/02/23 1758
